# Patient Record
Sex: Female | Born: 1950 | Race: White | Hispanic: No | Marital: Married | Smoking: Never smoker
Health system: Southern US, Community
[De-identification: ages and names within clinical notes are randomized; demographics above are authoritative.]

## PROBLEM LIST (undated history)

## (undated) DIAGNOSIS — E785 Hyperlipidemia, unspecified: Secondary | ICD-10-CM

## (undated) HISTORY — PX: ABDOMINAL HYSTERECTOMY: SHX81

## (undated) HISTORY — PX: TONSILLECTOMY: SUR1361

---

## 2010-01-29 HISTORY — PX: BREAST SURGERY: SHX581

## 2010-05-27 ENCOUNTER — Ambulatory Visit (INDEPENDENT_AMBULATORY_CARE_PROVIDER_SITE_OTHER): Payer: BC Managed Care – PPO | Admitting: Family Medicine

## 2010-05-27 ENCOUNTER — Encounter: Payer: Self-pay | Admitting: Family Medicine

## 2010-05-27 VITALS — BP 130/76 | HR 71 | Temp 98.2°F | Ht 63.5 in | Wt 142.0 lb

## 2010-05-27 DIAGNOSIS — M719 Bursopathy, unspecified: Secondary | ICD-10-CM

## 2010-05-27 DIAGNOSIS — M75100 Unspecified rotator cuff tear or rupture of unspecified shoulder, not specified as traumatic: Secondary | ICD-10-CM

## 2010-05-27 DIAGNOSIS — Z130 Encounter for screening for diseases of the blood and blood-forming organs and certain disorders involving the immune mechanism: Secondary | ICD-10-CM

## 2010-05-27 DIAGNOSIS — M67919 Unspecified disorder of synovium and tendon, unspecified shoulder: Secondary | ICD-10-CM

## 2010-05-27 DIAGNOSIS — Z13220 Encounter for screening for lipoid disorders: Secondary | ICD-10-CM

## 2010-05-27 DIAGNOSIS — I499 Cardiac arrhythmia, unspecified: Secondary | ICD-10-CM

## 2010-05-27 DIAGNOSIS — I491 Atrial premature depolarization: Secondary | ICD-10-CM | POA: Insufficient documentation

## 2010-05-27 DIAGNOSIS — M25511 Pain in right shoulder: Secondary | ICD-10-CM

## 2010-05-27 NOTE — Assessment & Plan Note (Signed)
3 mos of R shoulder pain c/w R shoulder bursitis/ RTC tendonopathy. Will treat with Aleve and ice for now.  Referral to sports med done today for injection and PT. Normal xray with Dr Myrtis Ser.

## 2010-05-27 NOTE — Patient Instructions (Signed)
Referral made to Dr Penni Bombard downstairs. Use ICE on and off and Aleve for pain and inflammation in the R shoulder.  Return for follow up / fasting labs in 3 mos.

## 2010-05-27 NOTE — Progress Notes (Signed)
  Subjective:    Patient ID: Suzanne Welch, female    DOB: [birth year], 60 y.o.   MRN: [id number]  HPI  60 yo WF presents for R shoulder pain that started the end of Dec 2011.  Denies injury, previous issue or surgery w/ R shoulder.  Radiation only into the deltoid w/o radiation down the arm.  Denies tingling, numbness or weakness.  Denies neck pain.  Has full ROM.  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  Aleve helps some but she does not want to stay on meds.  She saw Dr Myrtis Ser (chiropractor) and had manipulation done (didn't help) and an xray (normal).  Denies swelling, redenss, bruising.    BP 130/76  Pulse 71  Temp(Src) 98.2 F (36.8 C) (Oral)  Ht 5' 3.5" (1.613 m)  Wt 142 lb (64.411 kg)  BMI 24.76 kg/m2  SpO2 98% BP 130/76  Pulse 71  Temp(Src) 98.2 F (36.8 C) (Oral)  Ht 5' 3.5" (1.613 m)  Wt 142 lb (64.411 kg)  BMI 24.76 kg/m2  SpO2 98%  No past medical history on file.  Past Surgical History  Procedure Date  . Tonsillectomy   . Abdominal hysterectomy     for fibroids  . Cesarean section   . Breast surgery 01-2010    L breast biopsy - atypical hyperplasia    Family History  Problem Relation Age of Onset  . Cancer Mother   . Hypertension Mother   . Heart disease Father   . Hyperlipidemia Father   . Hypertension Father   . Thyroid disease Sister     History   Social History  . Marital Status: Married    Spouse Name: Sherilyn Cooter    Number of Children: 2  . Years of Education: grad schoo   Occupational History  . music publisher     [employer]   Social History Main Topics  . Smoking status: Never Smoker   . Smokeless tobacco: Never Used  . Alcohol Use: No  . Drug Use: No  . Sexually Active: Yes    Birth Control/ Protection: Surgical   Other Topics Concern  . Not on file   Social History Narrative  . No narrative on file    Allergies  Allergen Reactions  . Penicillins     No current outpatient prescriptions on  file.  Review of Systems  Denies CP, papitations, SOB, swelling or lightheadeness.     Objective:   Physical Exam  Constitutional: She appears well-developed and well-nourished. No distress.  Neck: Normal range of motion. Neck supple. No thyromegaly present.  Cardiovascular: Normal rate.  An irregular rhythm present.  Pulmonary/Chest: Effort normal and breath sounds normal. No respiratory distress. She has no wheezes. She has no rales.  Musculoskeletal:       Right shoulder: She exhibits tenderness (tender over R deltoid) and pain. She exhibits normal range of motion, no swelling, no effusion, no crepitus, no deformity, no spasm and normal strength.       Cervical back: She exhibits normal range of motion, no tenderness and no spasm.  Neurological:       Grip + 5/5  Psychiatric: She has a normal mood and affect.          Assessment & Plan:

## 2010-05-27 NOTE — Assessment & Plan Note (Signed)
EKG done for cardiac arrythmia shows sinus rhythm at rate of 63 bpm, normal axis with PACs. She is asymtpomatic.  Will screen for underlying cause- check electrolytes, thyroid function.  Avoid caffeine.

## 2010-06-01 ENCOUNTER — Encounter: Payer: Self-pay | Admitting: Family Medicine

## 2010-06-11 ENCOUNTER — Ambulatory Visit: Payer: BC Managed Care – PPO | Attending: Sports Medicine | Admitting: Physical Therapy

## 2010-06-11 DIAGNOSIS — M25519 Pain in unspecified shoulder: Secondary | ICD-10-CM | POA: Insufficient documentation

## 2010-06-11 DIAGNOSIS — M542 Cervicalgia: Secondary | ICD-10-CM | POA: Insufficient documentation

## 2010-06-11 DIAGNOSIS — M6281 Muscle weakness (generalized): Secondary | ICD-10-CM | POA: Insufficient documentation

## 2010-06-11 DIAGNOSIS — IMO0001 Reserved for inherently not codable concepts without codable children: Secondary | ICD-10-CM | POA: Insufficient documentation

## 2010-06-12 ENCOUNTER — Other Ambulatory Visit: Payer: BC Managed Care – PPO | Admitting: Family Medicine

## 2010-06-12 ENCOUNTER — Encounter: Payer: Self-pay | Admitting: Family Medicine

## 2010-06-12 DIAGNOSIS — I499 Cardiac arrhythmia, unspecified: Secondary | ICD-10-CM

## 2010-06-12 DIAGNOSIS — Z13220 Encounter for screening for lipoid disorders: Secondary | ICD-10-CM

## 2010-06-12 DIAGNOSIS — N6099 Unspecified benign mammary dysplasia of unspecified breast: Secondary | ICD-10-CM | POA: Insufficient documentation

## 2010-06-12 DIAGNOSIS — M810 Age-related osteoporosis without current pathological fracture: Secondary | ICD-10-CM | POA: Insufficient documentation

## 2010-06-12 DIAGNOSIS — Z13228 Encounter for screening for other metabolic disorders: Secondary | ICD-10-CM

## 2010-06-12 LAB — CBC WITH DIFFERENTIAL/PLATELET
Lymphocytes Relative: 44 % (ref 12–46)
Lymphs Abs: 2.7 10*3/uL (ref 0.7–4.0)
Neutrophils Relative %: 52 % (ref 43–77)
Platelets: 229 10*3/uL (ref 150–400)
RBC: 5.01 MIL/uL (ref 3.87–5.11)
WBC: 6.2 10*3/uL (ref 4.0–10.5)

## 2010-06-13 LAB — LIPID PANEL
Cholesterol: 273 mg/dL — ABNORMAL HIGH (ref 0–200)
LDL Cholesterol: 176 mg/dL — ABNORMAL HIGH (ref 0–99)
Triglycerides: 81 mg/dL (ref <150)

## 2010-06-13 LAB — COMPLETE METABOLIC PANEL WITHOUT GFR
ALT: 18 U/L (ref 0–35)
AST: 22 U/L (ref 0–37)
Albumin: 4.6 g/dL (ref 3.5–5.2)
Alkaline Phosphatase: 72 U/L (ref 39–117)
BUN: 22 mg/dL (ref 6–23)
CO2: 27 meq/L (ref 19–32)
Calcium: 9.5 mg/dL (ref 8.4–10.5)
Chloride: 102 meq/L (ref 96–112)
Creat: 0.7 mg/dL (ref 0.40–1.20)
GFR, Est African American: >60 mL/min
GFR, Est Non African American: >60 mL/min
Glucose, Bld: 83 mg/dL (ref 70–99)
Potassium: 4.5 meq/L (ref 3.5–5.3)
Sodium: 142 meq/L (ref 135–145)
Total Bilirubin: 1 mg/dL (ref 0.3–1.2)
Total Protein: 6.9 g/dL (ref 6.0–8.3)

## 2010-06-15 ENCOUNTER — Telehealth: Payer: Self-pay | Admitting: Family Medicine

## 2010-06-15 NOTE — Telephone Encounter (Signed)
Pls let pt know that her blood counts, fasting sugar, liver and kidney function came back normal but her cholesterol is very high.  Her total is 273 and her LDL bad chol is 176, 76 points above goal.  She needs cholesterol lowering meds to reduce her risk for heart attack and stroke.  I plan to start her on Lipitor at bedtime and recheck labs in 8 wks.  Is she OK with this?

## 2010-06-15 NOTE — Telephone Encounter (Signed)
Pt aware of the above but would like to discuss before starting any meds. Pt transferred to scheduling.

## 2010-06-16 ENCOUNTER — Ambulatory Visit: Payer: BC Managed Care – PPO | Admitting: Physical Therapy

## 2010-06-17 ENCOUNTER — Encounter: Payer: Self-pay | Admitting: Family Medicine

## 2010-06-17 ENCOUNTER — Ambulatory Visit (INDEPENDENT_AMBULATORY_CARE_PROVIDER_SITE_OTHER): Payer: BC Managed Care – PPO | Admitting: Family Medicine

## 2010-06-17 VITALS — BP 135/79 | HR 73 | Ht 63.5 in | Wt 141.0 lb

## 2010-06-17 DIAGNOSIS — E785 Hyperlipidemia, unspecified: Secondary | ICD-10-CM | POA: Insufficient documentation

## 2010-06-17 MED ORDER — ROSUVASTATIN CALCIUM 5 MG PO TABS: 5.0000 mg | ORAL_TABLET | Freq: Every day | ORAL | Status: DC

## 2010-06-17 NOTE — Patient Instructions (Signed)
Start Crestor 5 mg at bedtime for high cholesterol.  Call me if any problems.  I'd like to see you back 8 wks after starting it and we will recheck your LDL and liver function.  Work on [employer] (see [email or web address] ) for low cholesterol diet info and regular exercise.

## 2010-06-17 NOTE — Progress Notes (Signed)
  Subjective:    Patient ID: Suzanne Welch, female    DOB: 02-06-1951, 60 y.o.   MRN: [id number]  HPI  60 yo WF presents for f/u high cholesterol today.  Had labs drawn last wk and her LDL was 176.  She has never been on cholesterol medicine but has fam hx of high cholesterol and heart dz.  She is not a smoker, not diabetic and does not have HTN.  She eats fairly healthy but does not do much exercise.  She also has osteoporosis and a recent breast biopsy was + for atypical lobular hyperplasia and she is seeing oncology to discuss anti - esterogen therapy.  (likely Evista for both conditions).   BP 135/79  Pulse 73  Ht 5' 3.5" (1.613 m)  Wt 141 lb (63.957 kg)  BMI 24.59 kg/m2  SpO2 98% Patient Active Problem List  Diagnoses  . Right shoulder pain  . Premature atrial contraction  . Osteoporosis  . Atypical lobular hyperplasia of breast      Review of Systems  Respiratory: Negative for shortness of breath.   Cardiovascular: Negative for chest pain and palpitations.       Objective:   Physical Exam  Constitutional: She appears well-developed and well-nourished.       tearful  Cardiovascular: Normal rate, regular rhythm and normal heart sounds.   No murmur heard. Pulmonary/Chest: Effort normal and breath sounds normal.  Musculoskeletal: She exhibits no edema.          Assessment & Plan:

## 2010-06-17 NOTE — Assessment & Plan Note (Signed)
I reviewed Suzanne Welch's labs with her today and gave her a copy.  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  I explained that this can contributed to heart attack and stroke and that diet and exercise are not enough.  She finally agrees to starting Crestor 5 mg/ night.  She is postmenopausal.  Will see her back 8 wks after starting to recheck labs and she will let me know if she gets adverse side effects.

## 2010-06-18 ENCOUNTER — Ambulatory Visit: Payer: BC Managed Care – PPO

## 2010-06-22 ENCOUNTER — Ambulatory Visit: Payer: BC Managed Care – PPO | Admitting: Physical Therapy

## 2010-06-25 ENCOUNTER — Ambulatory Visit: Payer: BC Managed Care – PPO | Admitting: Physical Therapy

## 2010-06-30 ENCOUNTER — Ambulatory Visit: Payer: BC Managed Care – PPO | Attending: Sports Medicine | Admitting: Physical Therapy

## 2010-06-30 DIAGNOSIS — IMO0001 Reserved for inherently not codable concepts without codable children: Secondary | ICD-10-CM | POA: Insufficient documentation

## 2010-06-30 DIAGNOSIS — M542 Cervicalgia: Secondary | ICD-10-CM | POA: Insufficient documentation

## 2010-06-30 DIAGNOSIS — M25519 Pain in unspecified shoulder: Secondary | ICD-10-CM | POA: Insufficient documentation

## 2010-06-30 DIAGNOSIS — M6281 Muscle weakness (generalized): Secondary | ICD-10-CM | POA: Insufficient documentation

## 2010-07-03 ENCOUNTER — Ambulatory Visit: Payer: BC Managed Care – PPO | Admitting: Physical Therapy

## 2010-07-07 ENCOUNTER — Telehealth: Payer: Self-pay | Admitting: *Deleted

## 2010-07-07 NOTE — Telephone Encounter (Signed)
Pt called stating brother-in-law works for [employer] and he recommended Pt have LDL-P drawn to further look at her LDL bc he thinks she should not be on meds since her HDL and TGs were so good. Pt requests a CB from Dr. B to discuss. I called Darl Pikes in the lab and she said the only other lab for LDL is the direct LDL.

## 2010-07-08 NOTE — Telephone Encounter (Signed)
LMOM informing Pt of the above 

## 2010-07-08 NOTE — Telephone Encounter (Signed)
There is no data to support checking LDL particle size.   She has zero cardiovascular risk factors so her threshhold to start treatment is for LDL >160 and she is 176.  This is per NCEP guidelines.Though her HDL is good, the LDL can still clog arteries.  My recommendation is to treat her with statin therapy to reduce her risk for heart attack and stroke. If she wants to see the Pharm D at [facility], I will set this up for her.

## 2010-07-09 ENCOUNTER — Encounter: Payer: Self-pay | Admitting: Family Medicine

## 2010-08-21 ENCOUNTER — Other Ambulatory Visit: Payer: Self-pay | Admitting: *Deleted

## 2010-08-26 ENCOUNTER — Ambulatory Visit: Payer: BC Managed Care – PPO | Admitting: Family Medicine

## 2010-09-25 ENCOUNTER — Ambulatory Visit (INDEPENDENT_AMBULATORY_CARE_PROVIDER_SITE_OTHER): Payer: BC Managed Care – PPO | Admitting: Family Medicine

## 2010-09-25 ENCOUNTER — Encounter: Payer: Self-pay | Admitting: Family Medicine

## 2010-09-25 VITALS — BP 118/73 | HR 67 | Resp 20 | Ht 64.0 in | Wt 130.0 lb

## 2010-09-25 DIAGNOSIS — E785 Hyperlipidemia, unspecified: Secondary | ICD-10-CM

## 2010-09-25 DIAGNOSIS — N6099 Unspecified benign mammary dysplasia of unspecified breast: Secondary | ICD-10-CM

## 2010-09-25 LAB — HEPATIC FUNCTION PANEL
Albumin: 4.7 g/dL (ref 3.5–5.2)
Alkaline Phosphatase: 69 U/L (ref 39–117)
Indirect Bilirubin: 0.9 mg/dL (ref 0.0–0.9)
Total Bilirubin: 1.1 mg/dL (ref 0.3–1.2)

## 2010-09-25 LAB — LDL CHOLESTEROL, DIRECT: Direct LDL: 108 mg/dL — ABNORMAL HIGH

## 2010-09-25 NOTE — Assessment & Plan Note (Signed)
Will check a direct LDL and liver profile today for new start Crestor 2 mos ago.  Tolerating well.  Congratulated her on making lifestyle changes.

## 2010-09-25 NOTE — Progress Notes (Signed)
  Subjective:    Patient ID: Tecora Eustache, female    DOB: 1950/05/29, 60 y.o.   MRN: [id number]  HPI  60 yo WF presents for f/u high cholesterol.  She was started on Crestor 5 mg/hs 2 mos ago.  Tolerating well.  She has been walking for exercise.  Denies CP or DOE.  She has lost 11 lbs.  Eating healthy.  BP 118/73  Pulse 67  Resp 20  Ht 5\' 4"  (1.626 m)  Wt 130 lb (58.968 kg)  BMI 22.31 kg/m2  SpO2 98%  LMP 05/31/2007     Review of Systems as per HPI    Objective:   Physical Exam  Constitutional: She appears well-developed and well-nourished.  Cardiovascular: Normal rate, regular rhythm and normal heart sounds.   Pulmonary/Chest: Effort normal and breath sounds normal.  Musculoskeletal: She exhibits no edema.  Psychiatric: She has a normal mood and affect.          Assessment & Plan:   No problem-specific assessment & plan notes found for this encounter.

## 2010-09-25 NOTE — Patient Instructions (Addendum)
Labs downstairs today.  Will call you w/ results on Monday.  Keep up the good work and best of luck with upcoming breast MRI.  Return for a PHYSICAL in 6 mos.

## 2010-09-26 ENCOUNTER — Telehealth: Payer: Self-pay | Admitting: Family Medicine

## 2010-09-26 MED ORDER — ROSUVASTATIN CALCIUM 5 MG PO TABS: 5.0000 mg | ORAL_TABLET | Freq: Every day | ORAL | Status: DC

## 2010-09-26 NOTE — Telephone Encounter (Signed)
Pls let pt know that her bad LDL cholesterol dropped from 176 to 108 and liver enzymes are normal.  Great news.  Stay on current dose of Crestor.

## 2010-09-28 NOTE — Telephone Encounter (Signed)
LMOM informing Pt  

## 2010-12-02 DIAGNOSIS — C4491 Basal cell carcinoma of skin, unspecified: Secondary | ICD-10-CM

## 2010-12-02 HISTORY — DX: Basal cell carcinoma of skin, unspecified: C44.91

## 2010-12-17 ENCOUNTER — Encounter: Payer: Self-pay | Admitting: Family Medicine

## 2011-03-30 ENCOUNTER — Encounter: Payer: BC Managed Care – PPO | Admitting: Family Medicine

## 2011-07-19 DIAGNOSIS — C4491 Basal cell carcinoma of skin, unspecified: Secondary | ICD-10-CM | POA: Insufficient documentation

## 2011-07-19 DIAGNOSIS — Z803 Family history of malignant neoplasm of breast: Secondary | ICD-10-CM | POA: Insufficient documentation

## 2011-07-19 DIAGNOSIS — Z780 Asymptomatic menopausal state: Secondary | ICD-10-CM | POA: Insufficient documentation

## 2011-11-18 ENCOUNTER — Encounter: Payer: Self-pay | Admitting: Emergency Medicine

## 2011-11-18 ENCOUNTER — Emergency Department (INDEPENDENT_AMBULATORY_CARE_PROVIDER_SITE_OTHER)
Admission: EM | Admit: 2011-11-18 | Discharge: 2011-11-18 | Disposition: A | Discharge status: Home / Self Care | Payer: BC Managed Care – PPO | Source: Home / Self Care | Attending: Family Medicine | Admitting: Family Medicine

## 2011-11-18 ENCOUNTER — Emergency Department (INDEPENDENT_AMBULATORY_CARE_PROVIDER_SITE_OTHER): Payer: BC Managed Care – PPO

## 2011-11-18 DIAGNOSIS — W2209XA Striking against other stationary object, initial encounter: Secondary | ICD-10-CM

## 2011-11-18 DIAGNOSIS — M7989 Other specified soft tissue disorders: Secondary | ICD-10-CM

## 2011-11-18 DIAGNOSIS — H9190 Unspecified hearing loss, unspecified ear: Secondary | ICD-10-CM

## 2011-11-18 DIAGNOSIS — M26629 Arthralgia of temporomandibular joint, unspecified side: Secondary | ICD-10-CM

## 2011-11-18 DIAGNOSIS — H6121 Impacted cerumen, right ear: Secondary | ICD-10-CM

## 2011-11-18 DIAGNOSIS — H9202 Otalgia, left ear: Secondary | ICD-10-CM

## 2011-11-18 DIAGNOSIS — M25549 Pain in joints of unspecified hand: Secondary | ICD-10-CM

## 2011-11-18 DIAGNOSIS — S6000XA Contusion of unspecified finger without damage to nail, initial encounter: Secondary | ICD-10-CM

## 2011-11-18 DIAGNOSIS — IMO0001 Reserved for inherently not codable concepts without codable children: Secondary | ICD-10-CM

## 2011-11-18 MED ORDER — PREDNISONE 20 MG PO TABS: 20.0000 mg | ORAL_TABLET | Freq: Two times a day (BID) | ORAL | Status: DC

## 2011-11-18 NOTE — ED Notes (Signed)
Left ear pain x 1 month noticed ear did not clear after flying last month

## 2011-11-18 NOTE — ED Provider Notes (Signed)
History     CSN: [id number]  Arrival date & time 11/18/11  1140   First MD Initiated Contact with Patient 11/18/11 1158      Chief Complaint  Patient presents with  . Otalgia     HPI Comments: Patient presents with two problems: 1)  Two days ago she bumped her left 4th finger on a metal trailer, and has had persistent swelling/soreness of the finger.  She has not been able to remove her wedding rings.  She denies distal numbness of finger.  Her Tdap is current 2)  Approximately one month ago she travelled by plane and has persistent sensation of left ear being clogged.  She also has mild pain in her left ear area/jaw, worse when chewing.  She does not know if she grinds her teeth at night.  She reports that she has mild seasonal sinus congestion.  Patient is a 60 y.o. female presenting with hand pain and ear pain. The history is provided by the patient.  Hand Pain This is a new problem. The current episode started 2 days ago. The problem occurs constantly. The problem has not changed since onset.Pertinent negatives include no headaches. Associated symptoms comments: none. Exacerbated by: moving right 4th finger. Nothing relieves the symptoms. Treatments tried: ice pack. The treatment provided mild relief.  Otalgia This is a new problem. Episode onset: one month ago. There is pain in the left ear. The problem occurs constantly. The problem has not changed since onset.There has been no fever. The pain is mild. Associated symptoms include hearing loss. Pertinent negatives include no ear discharge, no headaches, no rhinorrhea, no sore throat, no vomiting, no cough and no rash.    History reviewed. No pertinent past medical history.  Past Surgical History  Procedure Date  . Tonsillectomy   . Abdominal hysterectomy     for fibroids  . Cesarean section   . Breast surgery 01-2010    L breast biopsy - atypical hyperplasia    Family History  Problem Relation Age of Onset  . Cancer Mother    . Hypertension Mother   . Heart disease Father   . Hyperlipidemia Father   . Hypertension Father   . Thyroid disease Sister     History  Substance Use Topics  . Smoking status: Never Smoker   . Smokeless tobacco: Never Used  . Alcohol Use: No    OB History    Grav Para Term Preterm Abortions TAB SAB Ect Mult Living   2 2 2  0 0     2      Review of Systems  HENT: Positive for hearing loss and ear pain. Negative for sore throat, rhinorrhea and ear discharge.   Respiratory: Negative for cough.   Gastrointestinal: Negative for vomiting.  Skin: Negative for rash.  Neurological: Negative for headaches.  All other systems reviewed and are negative.    Allergies  Penicillins  Home Medications   Current Outpatient Rx  Name Route Sig Dispense Refill  . CALTRATE 600+D PLUS PO Oral Take by mouth.      . OMEGA-3 FATTY ACIDS 1000 MG PO CAPS Oral Take 2 g by mouth daily.      Marland Kitchen MAGNESIUM 30 MG PO TABS Oral Take 30 mg by mouth 2 (two) times daily.      Marland Kitchen ONE-DAILY MULTI VITAMINS PO TABS Oral Take 1 tablet by mouth daily.      Marland Kitchen PREDNISONE 20 MG PO TABS Oral Take 1 tablet (20 mg total)  by mouth 2 (two) times daily. Take with food. 10 tablet 0  . ROSUVASTATIN CALCIUM 5 MG PO TABS Oral Take 1 tablet (5 mg total) by mouth at bedtime. 30 tablet 12  . VITAMIN B-1 100 MG PO TABS Oral Take 100 mg by mouth daily.        BP 134/82  Pulse 58  Temp 98.1 F (36.7 C) (Oral)  Resp 12  Ht 5' 3.5" (1.613 m)  Wt 127 lb (57.607 kg)  BMI 22.14 kg/m2  SpO2 98%  LMP 05/31/2007  Physical Exam Nursing notes and Vital Signs reviewed. Appearance:  Patient appears healthy, stated age, and in no acute distress Eyes:  Pupils are equal, round, and reactive to light and accomodation.  Extraocular movement is intact.  Conjunctivae are not inflamed  Ears:  Right canal occluded with cerumen; unable to visualize right tympanic membrane.  Left canal and tympanic membrane are normal.   There is distinct  tenderness to palpation over the left temporomandibular joint.  Palpation there recreates her left ear pain. Nose:  Mildly congested turbinates.  No sinus tenderness.   Pharynx:  Normal Neck:  Supple.  No adenopathy Skin:  No rash present.  Left 4th finger:  full range of motion all joints.  Her wedding ring is in place at base of proximal phalanx.  The distal aspect of proximal phalanx, and the PIP joint, are mildly swollen and tender.  There is enough edema of finger to prevent ring removal.  However, distal sensation and capillary refill are intact.  There is a 4mm dia superficial abrasion over the ulnar aspect of PIP joint, but there is no erythema or warmth, and no evidence of bacterial infection.  ED Course  Procedures   Lavage right ear:  Post lavage, right external auditory canal and tympanic membrane appear normal  Labs Reviewed - Tympanogram:  Normal left ear; low peak height right ear.  Post ear lavage, tympanogram right ear is normal (although "noisy") Dg Finger Ring Left  11/18/2011  *RADIOLOGY REPORT*  Clinical Data: Injured finger with tenderness distally  LEFT RING FINGER 2+V  Comparison: None.  Findings: No acute fracture is seen.  Alignment is normal.  Joint spaces appear normal.  IMPRESSION: Negative.   Original Report Authenticated By: Juline Patch, M.D.      1. Contusion of fourth finger, left   2. Impacted cerumen of right ear   3. TMJ tenderness   4. Otalgia of left ear       MDM   Begin prednisone burst. Apply ice pack to left 4th finger several times daily for about 10 minutes until swelling resolved.  Elevate hand.  Apply Bacitracin and Bandaid to abrasion until healed.  If increased swelling/pain occur in finger advised to return for follow-up; may need to have ring removed. Apply ice pack to left TMJ 2 or 3 times daily.  Begin jaw exercises. Followup with ENT if not improved one week.        Lattie Haw, MD 11/18/11 [id number]

## 2012-03-23 ENCOUNTER — Encounter: Payer: Self-pay | Admitting: *Deleted

## 2012-03-23 ENCOUNTER — Emergency Department (INDEPENDENT_AMBULATORY_CARE_PROVIDER_SITE_OTHER)
Admission: EM | Admit: 2012-03-23 | Discharge: 2012-03-23 | Disposition: A | Discharge status: Home / Self Care | Payer: BC Managed Care – PPO | Source: Home / Self Care | Attending: Family Medicine | Admitting: Family Medicine

## 2012-03-23 DIAGNOSIS — R51 Headache: Secondary | ICD-10-CM

## 2012-03-23 HISTORY — DX: Hyperlipidemia, unspecified: E78.5

## 2012-03-23 MED ORDER — AMOXICILLIN 875 MG PO TABS: 875.0000 mg | ORAL_TABLET | Freq: Two times a day (BID) | ORAL | Status: DC

## 2012-03-23 MED ORDER — PREDNISONE 20 MG PO TABS: 20.0000 mg | ORAL_TABLET | Freq: Two times a day (BID) | ORAL | Status: DC

## 2012-03-23 NOTE — ED Notes (Signed)
Patient c/o jaw pain, sinus pain and facial swelling.

## 2012-03-23 NOTE — ED Provider Notes (Signed)
History     CSN: [id number]  Arrival date & time 03/23/12  1421   First MD Initiated Contact with Patient 03/23/12 1504      Chief Complaint  Patient presents with  . Jaw Pain  . Sinus Problem      HPI Comments: Patient complains of increased sinus congestion and chills for about a week.  She has noted left facial discomfort and mild swelling   The history is provided by the patient.    Past Medical History  Diagnosis Date  . Hyperlipidemia     Past Surgical History  Procedure Date  . Tonsillectomy   . Abdominal hysterectomy     for fibroids  . Cesarean section   . Breast surgery 01-2010    L breast biopsy - atypical hyperplasia    Family History  Problem Relation Age of Onset  . Cancer Mother   . Hypertension Mother   . Heart disease Father   . Hyperlipidemia Father   . Hypertension Father   . Thyroid disease Sister     History  Substance Use Topics  . Smoking status: Never Smoker   . Smokeless tobacco: Never Used  . Alcohol Use: No    OB History    Grav Para Term Preterm Abortions TAB SAB Ect Mult Living   2 2 2  0 0     2      Review of Systems + sore throat + cough No pleuritic pain No wheezing + nasal congestion + post-nasal drainage ? sinus pain/pressure No itchy/red eyes No earache No hemoptysis No SOB No fever, + chills No nausea No vomiting No abdominal pain No diarrhea No urinary symptoms No skin rashes + fatigue No myalgias + headache Used OTC meds without relief    Allergies  Penicillins  Home Medications   Current Outpatient Rx  Name  Route  Sig  Dispense  Refill  . AMOXICILLIN 875 MG PO TABS   Oral   Take 1 tablet (875 mg total) by mouth 2 (two) times daily.   14 tablet   0   . CALTRATE 600+D PLUS PO   Oral   Take by mouth.           . OMEGA-3 FATTY ACIDS 1000 MG PO CAPS   Oral   Take 2 g by mouth daily.           Marland Kitchen MAGNESIUM 30 MG PO TABS   Oral   Take 30 mg by mouth 2 (two) times daily.          Marland Kitchen ONE-DAILY MULTI VITAMINS PO TABS   Oral   Take 1 tablet by mouth daily.           Marland Kitchen PREDNISONE 20 MG PO TABS   Oral   Take 1 tablet (20 mg total) by mouth 2 (two) times daily. Take with food.   10 tablet   0   . ROSUVASTATIN CALCIUM 5 MG PO TABS   Oral   Take 1 tablet (5 mg total) by mouth at bedtime.   30 tablet   12   . VITAMIN B-1 100 MG PO TABS   Oral   Take 100 mg by mouth daily.             BP 135/75  Pulse 75  Temp 98.1 F (36.7 C) (Oral)  Resp 12  Ht 5' 3.5" (1.613 m)  Wt 132 lb (59.875 kg)  BMI 23.02 kg/m2  SpO2 98%  LMP 05/31/2007  Physical Exam Nursing notes and Vital Signs reviewed. Appearance:  Patient appears healthy, stated age, and in no acute distress Eyes:  Pupils are equal, round, and reactive to light and accomodation.  Extraocular movement is intact.  Conjunctivae are not inflamed  Ears:  Canals normal.  Tympanic membranes normal.  Nose:  Mildly congested turbinates.   Left maxillary sinus tenderness is present.  Mouth:  Normal teeth Pharynx:  Normal Neck:  Supple.  Slightly tender shotty posterior nodes are palpated bilaterally  Lungs:  Clear to auscultation.  Breath sounds are equal.  Heart:  Regular rate and rhythm without murmurs, rubs, or gallops.  Skin:  No rash present.   ED Course  Procedures none      1. Facial pain; ?sinusitis       MDM  Suggested sinus films but patient wishes to minimize cost. Begin amoxicillin and prednisone burst Take Mucinex D (guaifenesin with decongestant) twice daily for congestion.  Increase fluid intake, rest. May use Afrin nasal spray (or generic oxymetazoline) twice daily for about 5 days.  Also recommend using saline nasal spray several times daily and saline nasal irrigation (AYR is a common brand) Stop all antihistamines for now, and other non-prescription cough/cold preparations. Followup with ENT if not improving         Lattie Haw, MD 03/24/12 1114

## 2013-10-01 ENCOUNTER — Ambulatory Visit (INDEPENDENT_AMBULATORY_CARE_PROVIDER_SITE_OTHER): Payer: BC Managed Care – PPO | Admitting: Physical Therapy

## 2013-10-01 DIAGNOSIS — M25619 Stiffness of unspecified shoulder, not elsewhere classified: Secondary | ICD-10-CM

## 2013-10-01 DIAGNOSIS — M6281 Muscle weakness (generalized): Secondary | ICD-10-CM

## 2013-10-01 DIAGNOSIS — M25519 Pain in unspecified shoulder: Secondary | ICD-10-CM

## 2013-10-01 DIAGNOSIS — R609 Edema, unspecified: Secondary | ICD-10-CM

## 2013-10-05 ENCOUNTER — Encounter (INDEPENDENT_AMBULATORY_CARE_PROVIDER_SITE_OTHER): Payer: BC Managed Care – PPO | Admitting: Physical Therapy

## 2013-10-05 DIAGNOSIS — M25619 Stiffness of unspecified shoulder, not elsewhere classified: Secondary | ICD-10-CM

## 2013-10-05 DIAGNOSIS — R609 Edema, unspecified: Secondary | ICD-10-CM

## 2013-10-05 DIAGNOSIS — M6281 Muscle weakness (generalized): Secondary | ICD-10-CM

## 2013-10-05 DIAGNOSIS — M25519 Pain in unspecified shoulder: Secondary | ICD-10-CM

## 2013-10-08 ENCOUNTER — Encounter (INDEPENDENT_AMBULATORY_CARE_PROVIDER_SITE_OTHER): Payer: BC Managed Care – PPO | Admitting: Physical Therapy

## 2013-10-08 DIAGNOSIS — R609 Edema, unspecified: Secondary | ICD-10-CM

## 2013-10-08 DIAGNOSIS — M25519 Pain in unspecified shoulder: Secondary | ICD-10-CM

## 2013-10-08 DIAGNOSIS — M6281 Muscle weakness (generalized): Secondary | ICD-10-CM

## 2013-10-08 DIAGNOSIS — M25619 Stiffness of unspecified shoulder, not elsewhere classified: Secondary | ICD-10-CM

## 2013-10-10 ENCOUNTER — Encounter (INDEPENDENT_AMBULATORY_CARE_PROVIDER_SITE_OTHER): Payer: BC Managed Care – PPO | Admitting: Physical Therapy

## 2013-10-10 DIAGNOSIS — M25519 Pain in unspecified shoulder: Secondary | ICD-10-CM

## 2013-10-10 DIAGNOSIS — M6281 Muscle weakness (generalized): Secondary | ICD-10-CM

## 2013-10-10 DIAGNOSIS — R609 Edema, unspecified: Secondary | ICD-10-CM

## 2013-10-10 DIAGNOSIS — M25619 Stiffness of unspecified shoulder, not elsewhere classified: Secondary | ICD-10-CM

## 2013-10-15 ENCOUNTER — Encounter (INDEPENDENT_AMBULATORY_CARE_PROVIDER_SITE_OTHER): Payer: BC Managed Care – PPO | Admitting: Physical Therapy

## 2013-10-15 DIAGNOSIS — R609 Edema, unspecified: Secondary | ICD-10-CM

## 2013-10-15 DIAGNOSIS — M25519 Pain in unspecified shoulder: Secondary | ICD-10-CM

## 2013-10-15 DIAGNOSIS — M6281 Muscle weakness (generalized): Secondary | ICD-10-CM

## 2013-10-15 DIAGNOSIS — M25619 Stiffness of unspecified shoulder, not elsewhere classified: Secondary | ICD-10-CM

## 2013-10-17 ENCOUNTER — Encounter (INDEPENDENT_AMBULATORY_CARE_PROVIDER_SITE_OTHER): Payer: BC Managed Care – PPO | Admitting: Physical Therapy

## 2013-10-17 DIAGNOSIS — M6281 Muscle weakness (generalized): Secondary | ICD-10-CM

## 2013-10-17 DIAGNOSIS — M25619 Stiffness of unspecified shoulder, not elsewhere classified: Secondary | ICD-10-CM

## 2013-10-17 DIAGNOSIS — R609 Edema, unspecified: Secondary | ICD-10-CM

## 2013-10-17 DIAGNOSIS — M25519 Pain in unspecified shoulder: Secondary | ICD-10-CM

## 2013-10-22 ENCOUNTER — Encounter: Payer: BC Managed Care – PPO | Admitting: Physical Therapy

## 2013-10-24 ENCOUNTER — Encounter (INDEPENDENT_AMBULATORY_CARE_PROVIDER_SITE_OTHER): Payer: BC Managed Care – PPO | Admitting: Physical Therapy

## 2013-10-24 DIAGNOSIS — M6281 Muscle weakness (generalized): Secondary | ICD-10-CM

## 2013-10-24 DIAGNOSIS — R609 Edema, unspecified: Secondary | ICD-10-CM

## 2013-10-24 DIAGNOSIS — M25519 Pain in unspecified shoulder: Secondary | ICD-10-CM

## 2013-10-24 DIAGNOSIS — M25619 Stiffness of unspecified shoulder, not elsewhere classified: Secondary | ICD-10-CM

## 2013-11-07 IMAGING — CR DG FINGER RING 2+V*L*
1 series · 1 of 1 positions shown · non-contrast
Comparison: None.

CLINICAL DATA: Injured finger with tenderness distally

LEFT RING FINGER 2+V

[view not recorded]
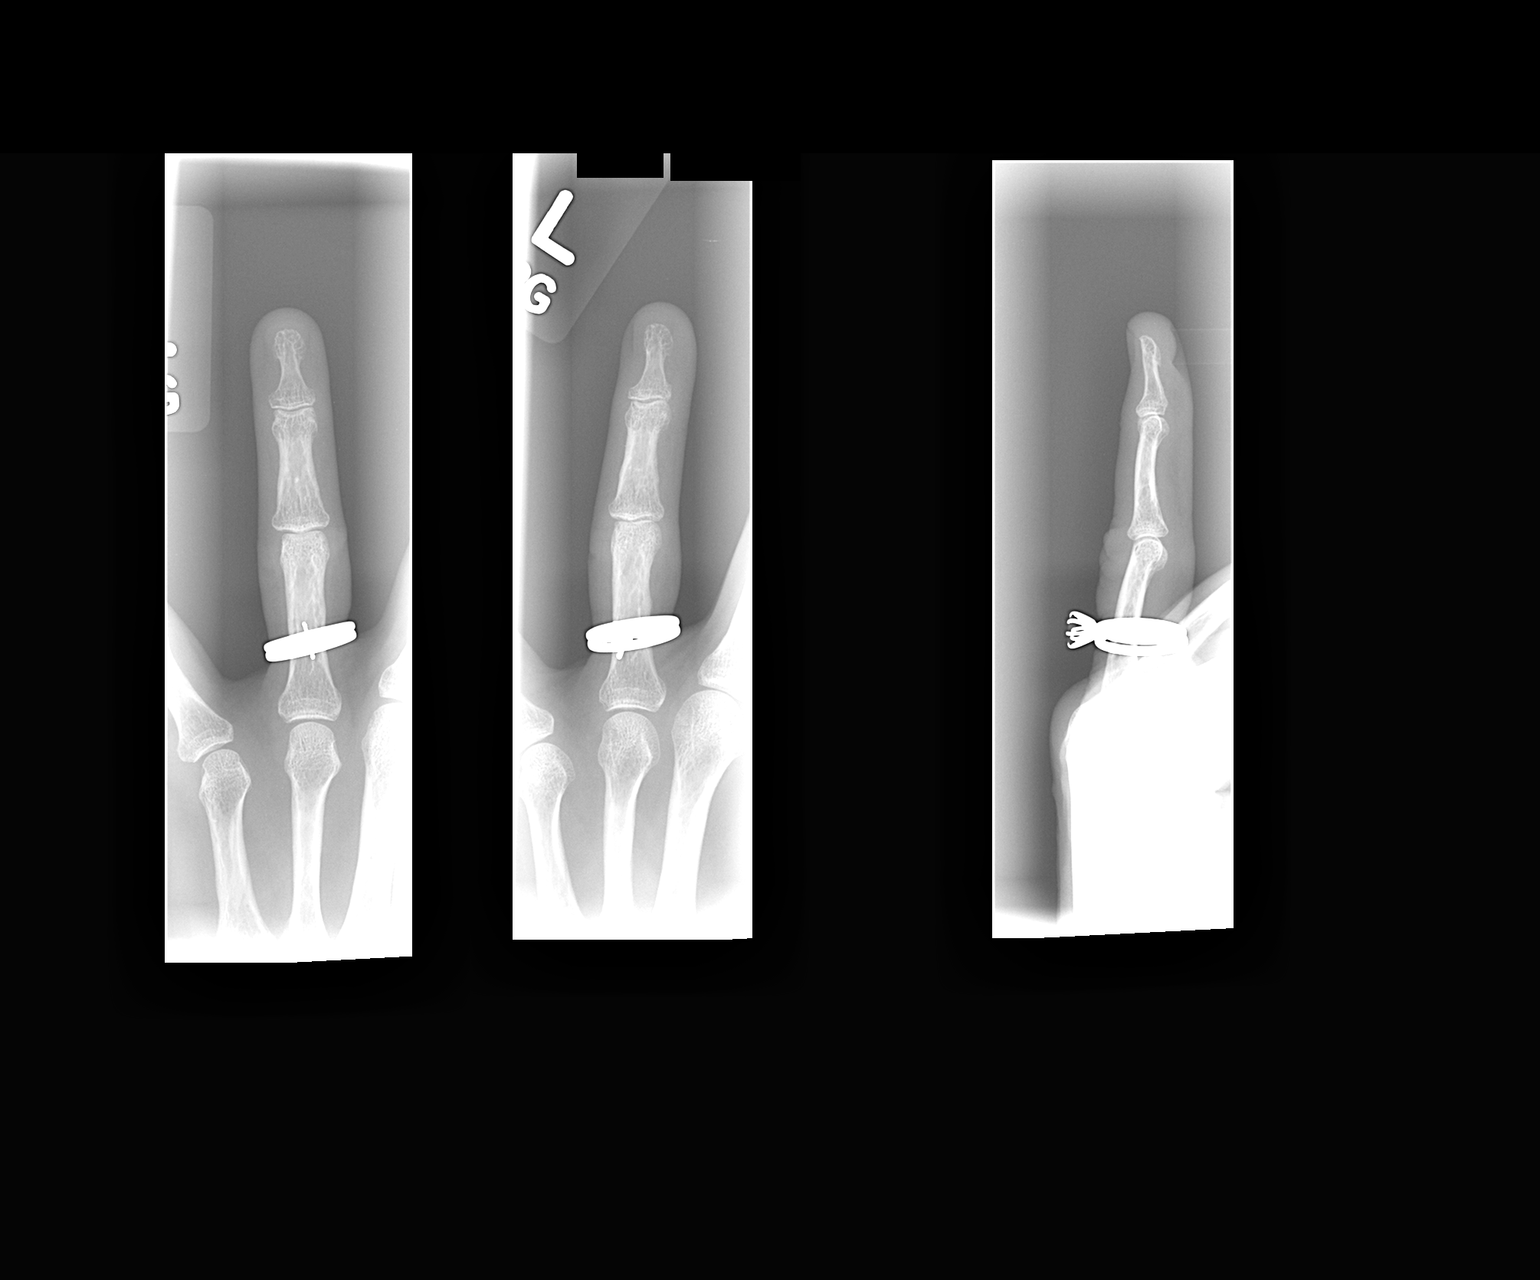

[1 of 1 positions shown; findings below may reference images not displayed]

FINDINGS: No acute fracture is seen.  Alignment is normal.  Joint
spaces appear normal.
IMPRESSION: Negative.

## 2013-12-31 ENCOUNTER — Encounter: Payer: Self-pay | Admitting: *Deleted

## 2015-08-25 ENCOUNTER — Encounter: Payer: Self-pay | Admitting: *Deleted

## 2015-08-25 ENCOUNTER — Emergency Department (INDEPENDENT_AMBULATORY_CARE_PROVIDER_SITE_OTHER)
Admission: EM | Admit: 2015-08-25 | Discharge: 2015-08-25 | Disposition: A | Discharge status: Home / Self Care | Payer: BLUE CROSS/BLUE SHIELD | Source: Home / Self Care | Attending: Family Medicine | Admitting: Family Medicine

## 2015-08-25 DIAGNOSIS — H9201 Otalgia, right ear: Secondary | ICD-10-CM | POA: Diagnosis not present

## 2015-08-25 DIAGNOSIS — J0100 Acute maxillary sinusitis, unspecified: Secondary | ICD-10-CM

## 2015-08-25 MED ORDER — PREDNISONE 20 MG PO TABS: ORAL_TABLET | ORAL | Status: DC

## 2015-08-25 MED ORDER — AMOXICILLIN-POT CLAVULANATE 875-125 MG PO TABS: 1.0000 | ORAL_TABLET | Freq: Two times a day (BID) | ORAL | Status: DC

## 2015-08-25 NOTE — ED Notes (Signed)
Pt c/o cold/uri symptoms x 3 weeks. Afebrile. More recently ear fullness and sinus pressure. Taken decongestant.

## 2015-08-25 NOTE — ED Provider Notes (Signed)
CSN: [id number]     Arrival date & time 08/25/15  1040 History   First MD Initiated Contact with Patient 08/25/15 1048     Chief Complaint  Patient presents with  . Cough  . Sinus Problem   (Consider location/radiation/quality/duration/timing/severity/associated sxs/prior Treatment) HPI Suzanne Welch is a 65 y.o. female presenting to UC with c/o worsening URI symptoms for about 3 weeks with associated Right sided facial pressure and ear fullness.  She has been taking decongestants w/o relief. Symptoms are moderate in severity. Mild productive cough. Denies chest pain or SOB. Denies n/v/d, fever or chills.    Past Medical History  Diagnosis Date  . Hyperlipidemia    Past Surgical History  Procedure Laterality Date  . Tonsillectomy    . Abdominal hysterectomy      for fibroids  . Cesarean section    . Breast surgery  01-2010    L breast biopsy - atypical hyperplasia   Family History  Problem Relation Age of Onset  . Cancer Mother   . Hypertension Mother   . Heart disease Father   . Hyperlipidemia Father   . Hypertension Father   . Thyroid disease Sister    Social History  Substance Use Topics  . Smoking status: Never Smoker   . Smokeless tobacco: Never Used  . Alcohol Use: No   OB History    Gravida Para Term Preterm AB TAB SAB Ectopic Multiple Living   2 2 2  0 0     2     Review of Systems  Constitutional: Negative for fever and chills.  HENT: Positive for congestion, ear pain ( "fullness"), sinus pressure and sore throat. Negative for sneezing, trouble swallowing and voice change.   Respiratory: Positive for cough. Negative for shortness of breath.   Cardiovascular: Negative for chest pain and palpitations.  Gastrointestinal: Negative for nausea, vomiting, abdominal pain and diarrhea.  Musculoskeletal: Negative for myalgias, back pain and arthralgias.  Skin: Negative for rash.    Allergies  Penicillins  Home Medications   Prior to Admission medications    Medication Sig Start Date End Date Taking? Authorizing Provider  atorvastatin (LIPITOR) 10 MG tablet Take 5 mg by mouth daily.   Yes Historical Provider, MD  amoxicillin-clavulanate (AUGMENTIN) 875-125 MG tablet Take 1 tablet by mouth 2 (two) times daily. One po bid x 7 days 08/25/15   Noland Fordyce, PA-C  predniSONE (DELTASONE) 20 MG tablet 3 tabs po day one, then 2 po daily x 4 days 08/25/15   Noland Fordyce, PA-C   Meds Ordered and Administered this Visit  Medications - No data to display  BP 149/83 mmHg  Pulse 55  Temp(Src) 98 F (36.7 C) (Oral)  Resp 14  Wt 143 lb (64.864 kg)  SpO2 100%  LMP 05/31/2007 No data found.   Physical Exam  Constitutional: She appears well-developed and well-nourished. No distress.  HENT:  Head: Normocephalic and atraumatic.  Right Ear: Tympanic membrane normal.  Left Ear: Tympanic membrane normal.  Nose: Right sinus exhibits maxillary sinus tenderness and frontal sinus tenderness. Left sinus exhibits maxillary sinus tenderness. Left sinus exhibits no frontal sinus tenderness.  Mouth/Throat: Uvula is midline, oropharynx is clear and moist and mucous membranes are normal.  Eyes: Conjunctivae are normal. No scleral icterus.  Neck: Normal range of motion. Neck supple.  Cardiovascular: Normal rate, regular rhythm and normal heart sounds.   Pulmonary/Chest: Effort normal and breath sounds normal. No respiratory distress. She has no wheezes. She has no rales.  Abdominal: Soft. She exhibits no distension. There is no tenderness.  Musculoskeletal: Normal range of motion.  Neurological: She is alert.  Skin: Skin is warm and dry. She is not diaphoretic.  Nursing note and vitals reviewed.   ED Course  Procedures (including critical care time)  Labs Review Labs Reviewed - No data to display  Imaging Review No results found.    MDM   1. Otalgia, right   2. Acute maxillary sinusitis, recurrence not specified    Worsening URI symptoms with Right  sided ear pain and pressure.   Hx and exam c/w sinusitis. Due to duration and worsening symptoms, will start on antibiotic.  Rx: prednisone and augmentin  Advised pt to use acetaminophen and ibuprofen as needed for fever and pain as well as sinus rinses. Encouraged rest and fluids. F/u with PCP in 7-10 days if not improving, sooner if worsening. Pt verbalized understanding and agreement with tx plan.      Noland Fordyce, PA-C 08/25/15 1624

## 2015-08-25 NOTE — Discharge Instructions (Signed)
You may take 400-600mg  Ibuprofen (Motrin) every 6-8 hours for fever and pain  Alternate with Tylenol  You may take 500mg  Tylenol every 4-6 hours as needed for fever and pain  Follow-up with your primary care provider next week for recheck of symptoms if not improving.  Be sure to drink plenty of fluids and rest, at least 8hrs of sleep a night, preferably more while you are sick. Return urgent care or go to closest ER if you cannot keep down fluids/signs of dehydration, fever not reducing with Tylenol, difficulty breathing/wheezing, stiff neck, worsening condition, or other concerns (see below)  Please take antibiotics as prescribed and be sure to complete entire course even if you start to feel better to ensure infection does not come back.  If you develop signs of an allergic reaction including rash, swelling of lips, tongue or throat, while taking the Augmentin, please stop taking the antibiotic immediately and call our office so we can change the medication.

## 2016-02-16 ENCOUNTER — Encounter: Payer: Self-pay | Admitting: *Deleted

## 2016-02-16 ENCOUNTER — Emergency Department (INDEPENDENT_AMBULATORY_CARE_PROVIDER_SITE_OTHER)
Admission: EM | Admit: 2016-02-16 | Discharge: 2016-02-16 | Disposition: A | Discharge status: Home / Self Care | Payer: BLUE CROSS/BLUE SHIELD | Source: Home / Self Care | Attending: Family Medicine | Admitting: Family Medicine

## 2016-02-16 DIAGNOSIS — J069 Acute upper respiratory infection, unspecified: Secondary | ICD-10-CM

## 2016-02-16 DIAGNOSIS — B9789 Other viral agents as the cause of diseases classified elsewhere: Secondary | ICD-10-CM

## 2016-02-16 MED ORDER — AZITHROMYCIN 250 MG PO TABS: ORAL_TABLET | ORAL | 0 refills | Status: DC

## 2016-02-16 MED ORDER — BENZONATATE 200 MG PO CAPS: 200.0000 mg | ORAL_CAPSULE | Freq: Every day | ORAL | 0 refills | Status: DC

## 2016-02-16 NOTE — ED Provider Notes (Signed)
[name]    CSN: [id number] Arrival date & time: 02/16/16  0808     History   Chief Complaint Chief Complaint  Patient presents with  . Cough  . Sore Throat    HPI Suzanne Welch is a 65 y.o. female.   Patient complains of six day history of typical cold-like symptoms developing over several days, including mild sore throat, sinus congestion, chills, headache, fatigue, and cough.   Her cough is worse at night.  She had low grade fever last night.   The history is provided by the patient.    Past Medical History:  Diagnosis Date  . Hyperlipidemia     Patient Active Problem List   Diagnosis Date Noted  . Hyperlipidemia 06/17/2010  . Osteoporosis 06/12/2010  . 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 Right shoulder pain 05/27/2010  . Premature atrial contraction 05/27/2010    Past Surgical History:  Procedure Laterality Date  . ABDOMINAL HYSTERECTOMY     for fibroids  . BREAST SURGERY  01-2010   L breast biopsy - atypical hyperplasia  . CESAREAN SECTION    . TONSILLECTOMY      OB History    Gravida Para Term Preterm AB Living   2 2 2  0 0 2   SAB TAB Ectopic Multiple Live Births                   Home Medications    Prior to Admission medications   Medication Sig Start Date End Date Taking? Authorizing Provider  atorvastatin (LIPITOR) 10 MG tablet Take 5 mg by mouth daily.    Historical Provider, MD  azithromycin (ZITHROMAX Z-PAK) 250 MG tablet Take 2 tabs today; then begin one tab once daily for 4 more days. (Rx void after 02/24/16) 02/16/16   Kandra Nicolas, MD  benzonatate (TESSALON) 200 MG capsule Take 1 capsule (200 mg total) by mouth at bedtime. Take as needed for cough 02/16/16   Kandra Nicolas, MD    Family History Family History  Problem Relation Age of Onset  . Cancer Mother   . Hypertension Mother   . Heart disease Father   . Hyperlipidemia Father   . Hypertension Father   . Thyroid disease Sister      Social History Social History  Substance Use Topics  . Smoking status: Never Smoker  . Smokeless tobacco: Never Used  . Alcohol use No     Allergies   Penicillins   Review of Systems Review of Systems  + sore throat + cough No pleuritic pain No wheezing + nasal congestion + post-nasal drainage No sinus pain/pressure No itchy/red eyes No earache No hemoptysis No SOB + fever, + chills No nausea No vomiting No abdominal pain No diarrhea No urinary symptoms No skin rash + fatigue + myalgias + headache Used OTC meds without relief    Physical Exam Triage Vital Signs ED Triage Vitals  Enc Vitals Group     BP 02/16/16 0822 155/84     Pulse Rate 02/16/16 0822 77     Resp 02/16/16 0822 14     Temp 02/16/16 0822 98.6 F (37 C)     Temp Source 02/16/16 0822 Oral     SpO2 02/16/16 0822 98 %     Weight 02/16/16 0823 138 lb (62.6 kg)     Height --      Head Circumference --      Peak Flow --  Pain Score 02/16/16 0824 0     Pain Loc --      Pain Edu? --      Excl. in [place]? --    No data found.   Updated Vital Signs BP 155/84 (BP Location: Left Arm)   Pulse 77   Temp 98.6 F (37 C) (Oral)   Resp 14   Wt 138 lb (62.6 kg)   LMP 05/31/2007   SpO2 98%   BMI 24.06 kg/m   Visual Acuity Right Eye Distance:   Left Eye Distance:   Bilateral Distance:    Right Eye Near:   Left Eye Near:    Bilateral Near:     Physical Exam Nursing notes and Vital Signs reviewed. Appearance:  Patient appears stated age, and in no acute distress Eyes:  Pupils are equal, round, and reactive to light and accomodation.  Extraocular movement is intact.  Conjunctivae are not inflamed  Ears:  Canals normal.  Tympanic membranes normal.  Nose:  Mildly congested turbinates.  No sinus tenderness.   Pharynx:  Normal Neck:  Supple.  Tender enlarged posterior/lateral nodes are palpated bilaterally  Lungs:  Clear to auscultation.  Breath sounds are equal.  Moving air  well. Chest:  Distinct tenderness to palpation over the mid-sternum.  Heart:  Regular rate and rhythm without murmurs, rubs, or gallops.  Abdomen:  Nontender without masses or hepatosplenomegaly.  Bowel sounds are present.  No CVA or flank tenderness.  Extremities:  No edema.  Skin:  No rash present.    UC Treatments / Results  Labs (all labs ordered are listed, but only abnormal results are displayed) Labs Reviewed - No data to display  EKG  EKG Interpretation None       Radiology No results found.  Procedures Procedures (including critical care time)  Medications Ordered in UC Medications - No data to display   Initial Impression / Assessment and Plan / UC Course  I have reviewed the triage vital signs and the nursing notes.  Pertinent labs & imaging results that were available during my care of the patient were reviewed by me and considered in my medical decision making (see chart for details).  Clinical Course   There is no evidence of bacterial infection today.   Prescription written for Benzonatate [facility]) to take at bedtime for night-time cough.   Take plain guaifenesin (1200mg  extended release tabs such as Mucinex) twice daily, with plenty of water, for cough and congestion.  May add Pseudoephedrine (30mg , one or two every 4 to 6 hours) for sinus congestion.  Get adequate rest.   May use Afrin nasal spray (or generic oxymetazoline) twice daily for about 5 days and then discontinue.  Also recommend using saline nasal spray several times daily and saline nasal irrigation (AYR is a common brand).  Use Flonase nasal spray each morning after using Afrin nasal spray and saline nasal irrigation. Try warm salt water gargles for sore throat.  Stop all antihistamines for now, and other non-prescription cough/cold preparations. May take Ibuprofen 200mg , 4 tabs every 8 hours with food for chest/sternum discomfort, body aches, etc. Begin Azithromycin if not improving about one  week or if persistent fever develops (Given a prescription to hold, with an expiration date)  Follow-up with family doctor if not improving about10 days.      Final Clinical Impressions(s) / UC Diagnoses   Final diagnoses:  Viral URI with cough    New Prescriptions New Prescriptions   AZITHROMYCIN (ZITHROMAX Z-PAK) 250  MG TABLET    Take 2 tabs today; then begin one tab once daily for 4 more days. (Rx void after 02/24/16)   BENZONATATE (TESSALON) 200 MG CAPSULE    Take 1 capsule (200 mg total) by mouth at bedtime. Take as needed for cough     Kandra Nicolas, MD 02/29/16 [phone]

## 2016-02-16 NOTE — Discharge Instructions (Signed)
Take plain guaifenesin (1200mg  extended release tabs such as Mucinex) twice daily, with plenty of water, for cough and congestion.  May add Pseudoephedrine (30mg , one or two every 4 to 6 hours) for sinus congestion.  Get adequate rest.   May use Afrin nasal spray (or generic oxymetazoline) twice daily for about 5 days and then discontinue.  Also recommend using saline nasal spray several times daily and saline nasal irrigation (AYR is a common brand).  Use Flonase nasal spray each morning after using Afrin nasal spray and saline nasal irrigation. Try warm salt water gargles for sore throat.  Stop all antihistamines for now, and other non-prescription cough/cold preparations. May take Ibuprofen 200mg , 4 tabs every 8 hours with food for chest/sternum discomfort, body aches, etc. Begin Azithromycin if not improving about one week or if persistent fever develops   Follow-up with family doctor if not improving about10 days.

## 2016-02-16 NOTE — ED Triage Notes (Signed)
5 days ago developed sore throat. Since developed productive cough, nasal congestion, low grade fever. Taken Mucinex otc

## 2018-04-01 ENCOUNTER — Ambulatory Visit (INDEPENDENT_AMBULATORY_CARE_PROVIDER_SITE_OTHER): Payer: BLUE CROSS/BLUE SHIELD | Admitting: Podiatry

## 2018-04-01 ENCOUNTER — Ambulatory Visit (INDEPENDENT_AMBULATORY_CARE_PROVIDER_SITE_OTHER): Payer: BLUE CROSS/BLUE SHIELD

## 2018-04-01 ENCOUNTER — Encounter: Payer: Self-pay | Admitting: Podiatry

## 2018-04-01 DIAGNOSIS — M21619 Bunion of unspecified foot: Secondary | ICD-10-CM

## 2018-04-01 DIAGNOSIS — M7752 Other enthesopathy of left foot: Secondary | ICD-10-CM | POA: Diagnosis not present

## 2018-04-01 DIAGNOSIS — M2042 Other hammer toe(s) (acquired), left foot: Secondary | ICD-10-CM

## 2018-04-01 DIAGNOSIS — M779 Enthesopathy, unspecified: Secondary | ICD-10-CM

## 2018-04-01 MED ORDER — TRIAMCINOLONE ACETONIDE 10 MG/ML IJ SUSP
10.0000 mg | Freq: Once | INTRAMUSCULAR | Status: AC
  Administered 2018-04-01: 10 mg

## 2018-04-01 NOTE — Progress Notes (Signed)
   Subjective:    Patient ID: Suzanne Welch, female    DOB: 11/18/1950, 68 y.o.   MRN: [id number]  HPI    Review of Systems  All other systems reviewed and are negative.      Objective:   Physical Exam        Assessment & Plan:

## 2018-04-01 NOTE — Patient Instructions (Signed)
Bunion  A bunion is a bump on the base of the big toe that forms when the bones of the big toe joint move out of position. Bunions may be small at first, but they often get larger over time. They can make walking painful. What are the causes? A bunion may be caused by:  Wearing narrow or pointed shoes that force the big toe to press against the other toes.  Abnormal foot development that causes the foot to roll inward (pronate).  Changes in the foot that are caused by certain diseases, such as rheumatoid arthritis or polio.  A foot injury. What increases the risk? The following factors may make you more likely to develop this condition:  Wearing shoes that squeeze the toes together.  Having certain diseases, such as: ? Rheumatoid arthritis. ? Polio. ? Cerebral palsy.  Having family members who have bunions.  Being born with a foot deformity, such as flat feet or low arches.  Doing activities that put a lot of pressure on the feet, such as ballet dancing. What are the signs or symptoms? The main symptom of a bunion is a noticeable bump on the big toe. Other symptoms may include:  Pain.  Swelling around the big toe.  Redness and inflammation.  Thick or hardened skin on the big toe or between the toes.  Stiffness or loss of motion in the big toe.  Trouble with walking. How is this diagnosed? A bunion may be diagnosed based on your symptoms, medical history, and activities. You may have tests, such as:  X-rays. These allow your health care provider to check the position of the bones in your foot and look for damage to your joint. They also help your health care provider determine the severity of your bunion and the best way to treat it.  Joint aspiration. In this test, a sample of fluid is removed from the toe joint. This test may be done if you are in a lot of pain. It helps rule out diseases that cause painful swelling of the joints, such as arthritis. How is this  treated? Treatment depends on the severity of your symptoms. The goal of treatment is to relieve symptoms and prevent the bunion from getting worse. Your health care provider may recommend:  Wearing shoes that have a wide toe box.  Using bunion pads to cushion the affected area.  Taping your toes together to keep them in a normal position.  Placing a device inside your shoe (orthotics) to help reduce pressure on your toe joint.  Taking medicine to ease pain, inflammation, and swelling.  Applying heat or ice to the affected area.  Doing stretching exercises.  Surgery to remove scar tissue and move the toes back into their normal position. This treatment is rare. Follow these instructions at home: Managing pain, stiffness, and swelling   If directed, put ice on the painful area: ? Put ice in a plastic bag. ? Place a towel between your skin and the bag. ? Leave the ice on for 20 minutes, 2-3 times a day. Activity   If directed, apply heat to the affected area before you exercise. Use the heat source that your health care provider recommends, such as a moist heat pack or a heating pad. ? Place a towel between your skin and the heat source. ? Leave the heat on for 20-30 minutes. ? Remove the heat if your skin turns bright red. This is especially important if you are unable to feel pain,   heat, or cold. You may have a greater risk of getting burned.  Do exercises as told by your health care provider. General instructions  Support your toe joint with proper footwear, shoe padding, or taping as told by your health care provider.  Take over-the-counter and prescription medicines only as told by your health care provider.  Keep all follow-up visits as told by your health care provider. This is important. Contact a health care provider if your symptoms:  Get worse.  Do not improve in 2 weeks. Get help right away if you have:  Severe pain and trouble with walking. Summary  A  bunion is a bump on the base of the big toe that forms when the bones of the big toe joint move out of position.  Bunions can make walking painful.  Treatment depends on the severity of your symptoms.  Support your toe joint with proper footwear, shoe padding, or taping as told by your health care provider. This information is not intended to replace advice given to you by your health care provider. Make sure you discuss any questions you have with your health care provider. Document Released: 02/15/2005 Document Revised: 06/28/2017 Document Reviewed: 06/28/2017 Elsevier Interactive Patient Education  2019 [employer]  

## 2018-04-10 NOTE — Progress Notes (Signed)
Subjective:   Patient ID: Suzanne Welch, female   DOB: 68 y.o.   MRN: [id number]   HPI Patient presents stating she has a bunion on the left foot that is really been bothering her and her toes of the third and fourth toes are informed to make it hard for her to be ambulatory and comfortable.  Patient states this is been going on for a while and gradually becoming more irritated for her and patient does not smoke and likes to be active   Review of Systems  All other systems reviewed and are negative.       Objective:  Physical Exam Vitals signs and nursing note reviewed.  Constitutional:      Appearance: She is well-developed.  Pulmonary:     Effort: Pulmonary effort is normal.  Musculoskeletal: Normal range of motion.  Skin:    General: Skin is warm.  Neurological:     Mental Status: She is alert.     Neurovascular status intact with muscle strength adequate range of motion within normal limits with patient found to have large structural bunion deformity left with redness around the first metatarsal head and inflammation pain between the third and fourth toes left foot with fluid buildup at the inner phalangeal joint and pain with palpation with keratotic tissue formation.  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  Patient will reappoint in the next 3 weeks and today proximal nerve block done I then injected the fourth interphalangeal joint with 1 mg dexamethasone and debrided lesion and applied padding and reappoint to recheck  X-ray indicates structural bunion deformity with digital deformities noted that are localized

## 2018-04-14 ENCOUNTER — Ambulatory Visit (INDEPENDENT_AMBULATORY_CARE_PROVIDER_SITE_OTHER): Payer: BLUE CROSS/BLUE SHIELD | Admitting: Podiatry

## 2018-04-14 ENCOUNTER — Encounter: Payer: Self-pay | Admitting: Podiatry

## 2018-04-14 DIAGNOSIS — M21619 Bunion of unspecified foot: Secondary | ICD-10-CM | POA: Diagnosis not present

## 2018-04-14 DIAGNOSIS — M2042 Other hammer toe(s) (acquired), left foot: Secondary | ICD-10-CM

## 2018-04-14 NOTE — Patient Instructions (Signed)
Pre-Operative Instructions  Congratulations, you have decided to take an important step towards improving your quality of life.  You can be assured that the doctors and staff at [facility] will be with you every step of the way.  Here are some important things you should know:  1. Plan to be at the surgery center/hospital at least 1 (one) hour prior to your scheduled time, unless otherwise directed by the surgical center/hospital staff.  You must have a responsible adult accompany you, remain during the surgery and drive you home.  Make sure you have directions to the surgical center/hospital to ensure you arrive on time. 2. If you are having surgery at [facility] or [facility], you will need a copy of your medical history and physical form from your family physician within one month prior to the date of surgery. We will give you a form for your primary physician to complete.  3. We make every effort to accommodate the date you request for surgery.  However, there are times where surgery dates or times have to be moved.  We will contact you as soon as possible if a change in schedule is required.   4. No aspirin/ibuprofen for one week before surgery.  If you are on aspirin, any non-steroidal anti-inflammatory medications (Mobic, Aleve, Ibuprofen) should not be taken seven (7) days prior to your surgery.  You make take Tylenol for pain prior to surgery.  5. Medications - If you are taking daily heart and blood pressure medications, seizure, reflux, allergy, asthma, anxiety, pain or diabetes medications, make sure you notify the surgery center/hospital before the day of surgery so they can tell you which medications you should take or avoid the day of surgery. 6. No food or drink after midnight the night before surgery unless directed otherwise by surgical center/hospital staff. 7. No alcoholic beverages 24-hours prior to surgery.  No smoking 24-hours prior or 24-hours after  surgery. 8. Wear loose pants or shorts. They should be loose enough to fit over bandages, boots, and casts. 9. Don't wear slip-on shoes. Sneakers are preferred. 10. Bring your boot with you to the surgery center/hospital.  Also bring crutches or a walker if your physician has prescribed it for you.  If you do not have this equipment, it will be provided for you after surgery. 11. If you have not been contacted by the surgery center/hospital by the day before your surgery, call to confirm the date and time of your surgery. 12. Leave-time from work may vary depending on the type of surgery you have.  Appropriate arrangements should be made prior to surgery with your employer. 13. Prescriptions will be provided immediately following surgery by your doctor.  Fill these as soon as possible after surgery and take the medication as directed. Pain medications will not be refilled on weekends and must be approved by the doctor. 14. Remove nail polish on the operative foot and avoid getting pedicures prior to surgery. 15. Wash the night before surgery.  The night before surgery wash the foot and leg well with water and the antibacterial soap provided. Be sure to pay special attention to beneath the toenails and in between the toes.  Wash for at least three (3) minutes. Rinse thoroughly with water and dry well with a towel.  Perform this wash unless told not to do so by your physician.  Enclosed: 1 Ice pack (please put in freezer the night before surgery)   1 Hibiclens skin cleaner     Pre-op instructions  If you have any questions regarding the instructions, please do not hesitate to call our office.  [place]: [address], [place], [place] [address] -- [phone]  [place]: [address] [place], [place] [address] -- [phone]  [place]: [address]  [place], [place] [address] -- [phone]  [place]: [address], [address], [place], [place] [address] -- [phone]  Website:  [email or web address] 

## 2018-04-16 NOTE — Progress Notes (Signed)
Subjective:   Patient ID: Suzanne Welch, female   DOB: 68 y.o.   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  I explained we will not get full correction of the bunion deformity but I do think with her age and clinical presentation this will give her good result and I did explain radiographically will not have full correction and I offered her [place]us with.  Nonweightbearing and she denies wanting this.  I allowed her to read consent form going over all possible complications and the fact recovery will be 6 months to 1 year and after extensive review she signed consent form and is dispensed air fracture walker with all instructions on usage.  She is scheduled for outpatient surgery and is encouraged to call with any questions concerns she may have

## 2018-05-02 ENCOUNTER — Encounter: Payer: Self-pay | Admitting: Podiatry

## 2018-05-02 DIAGNOSIS — M2012 Hallux valgus (acquired), left foot: Secondary | ICD-10-CM

## 2018-05-02 DIAGNOSIS — M2042 Other hammer toe(s) (acquired), left foot: Secondary | ICD-10-CM

## 2018-05-08 ENCOUNTER — Encounter: Payer: Self-pay | Admitting: Podiatry

## 2018-05-08 ENCOUNTER — Ambulatory Visit (INDEPENDENT_AMBULATORY_CARE_PROVIDER_SITE_OTHER): Payer: BLUE CROSS/BLUE SHIELD

## 2018-05-08 ENCOUNTER — Ambulatory Visit (INDEPENDENT_AMBULATORY_CARE_PROVIDER_SITE_OTHER): Payer: BLUE CROSS/BLUE SHIELD | Admitting: Podiatry

## 2018-05-08 VITALS — Temp 96.9°F

## 2018-05-08 DIAGNOSIS — M21619 Bunion of unspecified foot: Secondary | ICD-10-CM

## 2018-05-08 DIAGNOSIS — M2042 Other hammer toe(s) (acquired), left foot: Secondary | ICD-10-CM

## 2018-05-10 NOTE — Progress Notes (Signed)
Subjective:   Patient ID: Suzanne Welch, female   DOB: 68 y.o.   MRN: [id number]   HPI Patient states doing well with surgery with mild discomfort if she is on her foot for too long of a time   ROS      Objective:  Physical Exam  Neurovascular status intact negative Homans sign was noted wound edges well coapted left with incisions intact stitches intact and good correction of the first MPJ with motion that is normal for this.  Postop     Assessment:  Doing well post forefoot reconstruction left     Plan:  H&P conditions reviewed x-rays reviewed with patient and today sterile dressing reapplied gave instructions for continued elevation compression immobilization and reappoint 2 weeks or earlier if needed  X-rays indicate osteotomies healing well joint congruence digits in good alignment with satisfactory resection of bone

## 2018-05-22 ENCOUNTER — Encounter: Payer: Self-pay | Admitting: Podiatry

## 2018-05-22 ENCOUNTER — Ambulatory Visit (INDEPENDENT_AMBULATORY_CARE_PROVIDER_SITE_OTHER): Payer: Self-pay | Admitting: Podiatry

## 2018-05-22 ENCOUNTER — Ambulatory Visit (INDEPENDENT_AMBULATORY_CARE_PROVIDER_SITE_OTHER): Payer: BLUE CROSS/BLUE SHIELD

## 2018-05-22 ENCOUNTER — Other Ambulatory Visit: Payer: Self-pay

## 2018-05-22 DIAGNOSIS — M2012 Hallux valgus (acquired), left foot: Secondary | ICD-10-CM | POA: Diagnosis not present

## 2018-05-22 DIAGNOSIS — M2042 Other hammer toe(s) (acquired), left foot: Secondary | ICD-10-CM

## 2018-05-22 NOTE — Progress Notes (Signed)
Subjective:   Patient ID: Suzanne Welch, female   DOB: 68 y.o.   MRN: [id number]   HPI Patient states overall doing pretty well with some pain in the outside of the foot that she wanted to have checked but overall with the bunion was done and the toe seems to be doing well   ROS      Objective:  Physical Exam  Neurovascular status intact negative Homans sign was noted with patient found to have well-healed surgical sites left first metatarsal and third fourth and fifth digits left foot with wound edges coapted toes and good position     Assessment:  Doing well post osteotomy left hammertoe repair with patient being moderately active on the foot     Plan:  H&P x-rays reviewed condition discussed.  Some of the stitches removed with the rest to be removed in 2 weeks and I discussed slight stress on the osteotomy site and for her to continue with immobilization at this current time and range of motion exercises.  We will reevaluate again in 3 weeks and until that time she will continue immobilization compression elevation  X-rays indicate that the osteotomy is healing well fixation is in place with the possibility of a small crack line but if it is present it is minimal and should not be a problem but I will continue to monitor and she will continue immobilization

## 2018-06-05 ENCOUNTER — Telehealth: Payer: Self-pay | Admitting: Podiatry

## 2018-06-05 ENCOUNTER — Encounter: Payer: Self-pay | Admitting: Podiatry

## 2018-06-05 ENCOUNTER — Other Ambulatory Visit: Payer: Self-pay

## 2018-06-05 ENCOUNTER — Ambulatory Visit (INDEPENDENT_AMBULATORY_CARE_PROVIDER_SITE_OTHER): Payer: Self-pay

## 2018-06-05 ENCOUNTER — Ambulatory Visit (INDEPENDENT_AMBULATORY_CARE_PROVIDER_SITE_OTHER): Payer: Self-pay | Admitting: Podiatry

## 2018-06-05 VITALS — Temp 97.3°F

## 2018-06-05 DIAGNOSIS — M2012 Hallux valgus (acquired), left foot: Secondary | ICD-10-CM

## 2018-06-05 NOTE — Progress Notes (Signed)
Subjective:   Patient ID: Suzanne Welch, female   DOB: 68 y.o.   MRN: [id number]   HPI Patient states overall doing really well and just being careful not to do too much and is still wearing her boot for protection   ROS      Objective:  Physical Exam  Neurovascular status intact negative Homans sign noted with patient's left foot healing well with wound edges well coapted hallux in rectus position digits in good alignment with wound edges well coapted     Assessment:  Doing well post forefoot reconstruction left     Plan:  H&P condition reviewed and recommended continued range of motion exercises gradual return to surgical shoe soft shoe and stitches removed today with wound edges staying well coapted.  Patient will be seen back to recheck again 4 weeks or earlier if needed and will gradually increase activity during that time  X-ray indicates the osteotomy is healing well there is been slight movement in a dorsal direction but it is stable and should not be a problem from this point conveyed to patient

## 2018-06-05 NOTE — Telephone Encounter (Signed)
Pt is scheduled to come in today to have stitches removed and was wondering if she could take some type of pain medication before her appt to help with the pain of having them removed. Please give patient a call. Pts appt is at 11:30am.

## 2018-06-08 ENCOUNTER — Telehealth: Payer: Self-pay | Admitting: Podiatry

## 2018-06-08 NOTE — Telephone Encounter (Signed)
Pt states the surgery site rubs on the areas the sutures have been removed, does she need to apply neosporin to the areas. I told pt to pad off the areas with gause and the extra socks and decrease activity, RICE therapy and OTC NSAIDS if tolerates take as package instructs. Pt states understanding and thanked me for the instructions.

## 2018-07-03 ENCOUNTER — Encounter: Payer: Self-pay | Admitting: Podiatry

## 2018-07-03 ENCOUNTER — Other Ambulatory Visit: Payer: Self-pay

## 2018-07-03 ENCOUNTER — Ambulatory Visit: Payer: Medicare Other | Admitting: Podiatry

## 2018-07-03 ENCOUNTER — Ambulatory Visit (INDEPENDENT_AMBULATORY_CARE_PROVIDER_SITE_OTHER): Payer: Medicare Other

## 2018-07-03 DIAGNOSIS — M2012 Hallux valgus (acquired), left foot: Secondary | ICD-10-CM

## 2018-07-03 DIAGNOSIS — M2042 Other hammer toe(s) (acquired), left foot: Secondary | ICD-10-CM

## 2018-07-03 NOTE — Progress Notes (Signed)
Subjective:   Patient ID: Suzanne Welch, female   DOB: 68 y.o.   MRN: [id number]   HPI Patient states her foot continues to improve and she is walking more more and still does get some swelling if she is been on it too long   ROS      Objective:  Physical Exam  Neurovascular status intact negative Homans sign noted wound edges well coapted left foot with good alignment good range of motion no crepitus of the joint     Assessment:  Doing well post osteotomy digital procedures 345 left     Plan:  H&P x-rays reviewed advised patient to gradually increase activities and advised on increased utilization of shoe gear.  Reappoint for [place] to recheck  X-ray indicates that the osteotomy is healing well fixation in place and reduction of the intermetatarsal angle noted

## 2018-08-28 ENCOUNTER — Ambulatory Visit (INDEPENDENT_AMBULATORY_CARE_PROVIDER_SITE_OTHER): Payer: Medicare Other

## 2018-08-28 ENCOUNTER — Encounter: Payer: Self-pay | Admitting: Podiatry

## 2018-08-28 ENCOUNTER — Other Ambulatory Visit: Payer: Self-pay

## 2018-08-28 ENCOUNTER — Ambulatory Visit: Payer: Medicare Other | Admitting: Podiatry

## 2018-08-28 ENCOUNTER — Other Ambulatory Visit: Payer: Self-pay | Admitting: Podiatry

## 2018-08-28 VITALS — Temp 98.5°F

## 2018-08-28 DIAGNOSIS — M79672 Pain in left foot: Secondary | ICD-10-CM

## 2018-08-28 DIAGNOSIS — M2012 Hallux valgus (acquired), left foot: Secondary | ICD-10-CM

## 2018-08-29 NOTE — Progress Notes (Signed)
Subjective:   Patient ID: Suzanne Welch, female   DOB: 68 y.o.   MRN: [id number]   HPI Patient states still getting mild swelling but overall is doing very well with the left foot   ROS      Objective:  Physical Exam  Neurovascular status intact negative Homans sign noted wound edges well coapted and lesions between toes completely resolved with mild edema still noted of the digits     Assessment:  Overall doing well with edema still noted which is consistent with healing process     Plan:  H&P x-rays reviewed and advised on gradual return to normal activities and swelling should continue to reduce  X-rays indicate osteotomies are healing well good alignment is noted

## 2020-03-12 ENCOUNTER — Ambulatory Visit: Payer: Medicare Other | Admitting: Dermatology

## 2020-03-12 ENCOUNTER — Encounter: Payer: Self-pay | Admitting: Dermatology

## 2020-03-12 ENCOUNTER — Other Ambulatory Visit: Payer: Self-pay

## 2020-03-12 DIAGNOSIS — R21 Rash and other nonspecific skin eruption: Secondary | ICD-10-CM | POA: Diagnosis not present

## 2020-03-12 DIAGNOSIS — Z85828 Personal history of other malignant neoplasm of skin: Secondary | ICD-10-CM | POA: Diagnosis not present

## 2020-03-12 DIAGNOSIS — L821 Other seborrheic keratosis: Secondary | ICD-10-CM

## 2020-03-12 DIAGNOSIS — Z1283 Encounter for screening for malignant neoplasm of skin: Secondary | ICD-10-CM

## 2020-03-12 DIAGNOSIS — L409 Psoriasis, unspecified: Secondary | ICD-10-CM

## 2020-03-12 MED ORDER — CLOBETASOL PROPIONATE 0.05 % EX FOAM: Freq: Two times a day (BID) | CUTANEOUS | 3 refills | Status: AC

## 2020-03-12 MED ORDER — METRONIDAZOLE 0.75 % EX GEL: 1.0000 "application " | Freq: Every day | CUTANEOUS | 1 refills | Status: AC

## 2020-03-16 ENCOUNTER — Encounter: Payer: Self-pay | Admitting: Dermatology

## 2020-03-16 NOTE — Progress Notes (Signed)
   Follow-Up Visit   Subjective  Suzanne Welch is a 70 y.o. female who presents for the following: Annual Exam (Face- few crusty spots).  Psoriasis Location: Scalp Duration:  Quality:  Associated Signs/Symptoms: Modifying Factors:  Severity:  Timing: Context: Also would like general skin check including crusts on face  Objective  Well appearing patient in no apparent distress; mood and affect are within normal limits. Objective  Mid Tip of Nose: No sign recurrence but half dozen inflammatory papules suggestive of rosacea  Objective  Left Elbow - Posterior, Left Hip (side) - Posterior, Neck - Posterior, Right Hip (side) - Posterior: Rash from covid shot on buttocks (resolved), but history of breaking out elbows and post neck comes and goes for years  Objective  Right Thigh - Anterior: Light brown textured 4 mm flattopped papules each leg  Objective  Chest - Medial (Center): Back and all sun exposed areas examined; no atypical moles, melanoma, nonmole skin cancer.  Objective  Mid Occipital Scalp: Localized moderately thick scale.  No involvement ears, elbows, nails.    A full examination was performed including scalp, head, eyes, ears, nose, lips, neck, chest, axillae, abdomen, back, buttocks, bilateral upper extremities, bilateral lower extremities, hands, feet, fingers, toes, fingernails, and toenails. All findings within normal limits unless otherwise noted below.   Assessment & Plan    History of basal cell cancer Mid Tip of Nose  Yearly skin check, treat rosacea with topical metronidazole  metroNIDAZOLE (METROGEL) 0.75 % gel - Mid Tip of Nose  Rash and other nonspecific skin eruption (4) Left Elbow - Posterior; Neck - Posterior; Left Hip (side) - Posterior; Right Hip (side) - Posterior  May use the topical clobetasol on a prn basis, avoid face and body folds.  Seborrheic keratosis Right Thigh - Anterior  Leave if stable  Screening exam for skin  cancer Chest - Medial [facility])  Annual skin examination  Psoriasis Mid Occipital Scalp  If out-of-pocket is not to high, will use clobetasol foam.  If improves after daily use for a couple of weeks she will try tapering the use.  Avoid use on face.  clobetasol (OLUX) 0.05 % topical foam - Mid Occipital Scalp      I, Lavonna Monarch, MD, have reviewed all documentation for this visit.  The documentation on 03/16/20 for the exam, diagnosis, procedures, and orders are all accurate and complete.

## 2020-05-20 ENCOUNTER — Ambulatory Visit: Payer: Medicare Other | Admitting: Dermatology

## 2020-05-20 ENCOUNTER — Encounter: Payer: Self-pay | Admitting: Dermatology

## 2020-05-20 ENCOUNTER — Other Ambulatory Visit: Payer: Self-pay

## 2020-05-20 DIAGNOSIS — L578 Other skin changes due to chronic exposure to nonionizing radiation: Secondary | ICD-10-CM | POA: Diagnosis not present

## 2020-05-20 DIAGNOSIS — L409 Psoriasis, unspecified: Secondary | ICD-10-CM | POA: Diagnosis not present

## 2020-05-20 DIAGNOSIS — L719 Rosacea, unspecified: Secondary | ICD-10-CM | POA: Diagnosis not present

## 2020-05-20 DIAGNOSIS — Z85828 Personal history of other malignant neoplasm of skin: Secondary | ICD-10-CM

## 2020-05-20 NOTE — Progress Notes (Signed)
clobetasol

## 2020-05-20 NOTE — Patient Instructions (Addendum)
[email or web address]  ?

## 2020-05-31 ENCOUNTER — Encounter: Payer: Self-pay | Admitting: Dermatology

## 2020-05-31 NOTE — Progress Notes (Signed)
   Follow-Up Visit   Subjective  Suzanne Welch is a 70 y.o. female who presents for the following: Follow-up (Follow up. Per patient she has rosacea on face and psoriasis (elbows, back of neck, now on arms). No flare up. Per patient doesn't remember which medication was for which, but said the clobetasol seemed to work. ).  Psoriasis and rosacea Location:  Duration:  Quality:  Associated Signs/Symptoms: Modifying Factors:  Severity:  Timing: Context:   Objective  Well appearing patient in no apparent distress; mood and affect are within normal limits. Objective  Mid Supratip of Nose, Right Forearm - Anterior: Scar clear  Objective  Left Forearm - Anterior, Left Upper Arm - Anterior, Mid Occipital Scalp, Right Antecubital Fossa, Right Forearm - Anterior: Pink 1 to 2 cm plaques; moderate itching.  Objective  Left Buccal Cheek, Right Buccal Cheek: Patchy central facial erythema without pustules  Objective  Right Buccal Cheek: Mixture of erythema from UV damage, COVID mask, and perhaps rosacea.    A focused examination was performed including Head, neck, arms.. Relevant physical exam findings are noted in the Assessment and Plan.   Assessment & Plan    History of basal cell carcinoma (BCC) (2) Right Forearm - Anterior; Mid Supratip of Nose  Yearly skin check  Psoriasis (5) Right Antecubital Fossa; Left Upper Arm - Anterior; Left Forearm - Anterior; Right Forearm - Anterior; Mid Occipital Scalp  May use the same clobetasol foam she has for scalp on arms.  Avoid use on face and body folds.  Other Related Medications clobetasol (OLUX) 0.05 % topical foam  Rosacea (2) Left Buccal Cheek; Right Buccal Cheek  Will contact me for topical therapy if worsens.  Sun-damaged skin Right Buccal Cheek  No intervention initiated.      I, Lavonna Monarch, MD, have reviewed all documentation for this visit.  The documentation on 05/31/20 for the exam, diagnosis,  procedures, and orders are all accurate and complete.

## 2020-12-22 ENCOUNTER — Ambulatory Visit: Payer: Medicare Other | Admitting: Dermatology

## 2021-03-10 ENCOUNTER — Telehealth: Payer: Self-pay

## 2021-03-10 ENCOUNTER — Ambulatory Visit: Payer: Medicare Other | Admitting: Dermatology

## 2021-03-10 ENCOUNTER — Other Ambulatory Visit: Payer: Self-pay

## 2021-03-10 DIAGNOSIS — L570 Actinic keratosis: Secondary | ICD-10-CM | POA: Diagnosis not present

## 2021-03-10 DIAGNOSIS — L409 Psoriasis, unspecified: Secondary | ICD-10-CM

## 2021-03-10 DIAGNOSIS — L821 Other seborrheic keratosis: Secondary | ICD-10-CM

## 2021-03-10 DIAGNOSIS — Z5181 Encounter for therapeutic drug level monitoring: Secondary | ICD-10-CM

## 2021-03-10 DIAGNOSIS — Z1283 Encounter for screening for malignant neoplasm of skin: Secondary | ICD-10-CM | POA: Diagnosis not present

## 2021-03-10 NOTE — Telephone Encounter (Signed)
Phone call from patient stating that she saw Dr. Denna Haggard this morning and she was told by Dr. Denna Haggard that on her after visit summary there would be information regarding a new injection he would like her to read up on and think about trying.  Patient states that on her after visit summary non of that information was listed and she wanted to know what to do?  I asked patient if she had my chart and if so then the information would be in her my chart. Patient states that she has the information to sign up for my chart and she will set it up so she can read up on the injection.

## 2021-03-10 NOTE — Patient Instructions (Addendum)
[email or web address]  Risankizumab Injection What is this medication? RISANKIZUMAB (RIS an KIZ ue mab) reduces skin swelling, redness, itching, or rashes caused by psoriasis. It also treats psoriatic arthritis and Crohn's disease. It works on the immune system to reduce inflammation in the body. This medicine may be used for other purposes; ask your health care provider or pharmacist if you have questions. COMMON BRAND NAME(S): Skyrizi What should I tell my care team before I take this medication? They need to know if you have any of these conditions: Hepatic disease Immune system problems Infection such as tuberculosis (TB) or other bacterial, fungal or viral infections Recent or upcoming vaccine An unusual or allergic reaction to risankizumab, other medications, foods, dyes or preservatives Pregnant or trying to get pregnant Breast-feeding How should I use this medication? This medication is injected into a vein or under the skin. It is administered by someone from your care team. It may also be given at home. If you get this medication at home, you will be taught how to prepare and give it. Take it as directed on the prescription label. Keep taking it unless your care team tells you to stop. If you use a pen, be sure to take off the outer needle cover before using the dose. It is important that you put your used needles and syringes in a special sharps container. Do not put them in a trash can. If you do not have a sharps container, call your pharmacist or care team to get one. A special MedGuide will be given to you by the pharmacist with each prescription and refill. Be sure to read this information carefully each time. This medication comes with INSTRUCTIONS FOR USE. Ask your pharmacist for directions on how to use this medication. Read the information carefully. Talk to your pharmacist or care team if you have questions. Talk to your care team about the use of this medication in children.  Special care may be needed. Overdosage: If you think you have taken too much of this medicine contact a poison control center or emergency room at once. NOTE: This medicine is only for you. Do not share this medicine with others. What if I miss a dose? It is important not to miss any doses. Talk to your care team about what to do if you miss a dose. What may interact with this medication? Do not take this medication with any of the following: Live vaccines This list may not describe all possible interactions. Give your health care provider a list of all the medicines, herbs, non-prescription drugs, or dietary supplements you use. Also tell them if you smoke, drink alcohol, or use illegal drugs. Some items may interact with your medicine. What should I watch for while using this medication? Visit your care team for regular checks on your progress. Tell your care team if your symptoms do not start to get better or if they get worse. You will be tested for tuberculosis (TB) before you start this medication. If your care team prescribes any medication for TB, you should start taking the TB medication before starting this medication. Make sure to finish the full course of TB medication. This medication may increase your risk of getting an infection. Call your care team for advice if you get a fever chills, sore throat, or other symptoms of a cold or flu. Do not treat yourself. Try to avoid being around people who are sick. This medication can decrease the response to a vaccine. If you need  to get vaccinated, tell your care team if you have received this medication. Extra booster doses may be needed. Talk to your care team to see if a different vaccination schedule is needed. What side effects may I notice from receiving this medication? Side effects that you should report to your care team as soon as possible: Allergic reactions--skin rash, itching, hives, swelling of the face, lips, tongue, or  throat Infection--fever, chills, cough, sore throat, wounds that don't heal, pain or trouble when passing urine, general feeling of discomfort or being unwell Liver injury--dark yellow or brown urine, general ill feeling or flu-like symptoms, loss of appetite, right upper belly pain, unusually weak or tired, yellowing of the eyes or skin Low red blood cell counts--trouble breathing, feeling faint, lightheaded, falls, unusually weak or tired Skin infection--skin redness, swelling, warmth, or pain Side effects that usually do not require medical attention (report these to your care team if they continue or are bothersome): Back pain Fatigue Headache Joint pain Pain, redness, or irritation at injection site Stomach pain This list may not describe all possible side effects. Call your doctor for medical advice about side effects. You may report side effects to FDA at 1-800-FDA-1088. Where should I keep my medication? Keep out of the reach of children and pets. Store in a refrigerator. Do not freeze. Protect from light. Keep it in the original carton until you are ready to take it. See product for storage information. Each product may have different instructions. Remove the dose from the carton about 30 to 45 minutes before it is time for you to take it. Get rid of any unused medication after the expiration date. To get rid of medications that are no longer needed or have expired: Take the medication to a medication take-back program. Check with your pharmacy or law enforcement to find a location. If you cannot return the medication, ask your pharmacist or care team how to get rid of this medicine safely. NOTE: This sheet is a summary. It may not cover all possible information. If you have questions about this medicine, talk to your doctor, pharmacist, or health care provider.  2022 Elsevier/Gold Standard (2020-08-15 00:00:00)

## 2021-03-17 LAB — QUANTIFERON-TB GOLD PLUS
Mitogen-NIL: >10 IU/mL
NIL: 0.08 IU/mL
QuantiFERON-TB Gold Plus: NEGATIVE
TB1-NIL: 0.09 IU/mL
TB2-NIL: 0.06 IU/mL

## 2021-03-17 LAB — COMPREHENSIVE METABOLIC PANEL
AG Ratio: 1.9 (calc) (ref 1.0–2.5)
ALT: 83 U/L — ABNORMAL HIGH (ref 6–29)
AST: 54 U/L — ABNORMAL HIGH (ref 10–35)
Albumin: 4.4 g/dL (ref 3.6–5.1)
Alkaline phosphatase (APISO): 69 U/L (ref 37–153)
BUN: 20 mg/dL (ref 7–25)
CO2: 29 mmol/L (ref 20–32)
Calcium: 9.9 mg/dL (ref 8.6–10.4)
Chloride: 106 mmol/L (ref 98–110)
Creat: 0.68 mg/dL (ref 0.50–1.05)
Globulin: 2.3 g/dL (calc) (ref 1.9–3.7)
Glucose, Bld: 81 mg/dL (ref 65–99)
Potassium: 4.4 mmol/L (ref 3.5–5.3)
Sodium: 142 mmol/L (ref 135–146)
Total Bilirubin: 0.5 mg/dL (ref 0.2–1.2)
Total Protein: 6.7 g/dL (ref 6.1–8.1)

## 2021-03-17 LAB — CBC WITH DIFFERENTIAL/PLATELET
Absolute Monocytes: 639 cells/uL (ref 200–950)
Basophils Absolute: 61 cells/uL (ref 0–200)
Basophils Relative: 0.9 %
Eosinophils Absolute: 27 cells/uL (ref 15–500)
Eosinophils Relative: 0.4 %
HCT: 45.7 % — ABNORMAL HIGH (ref 35.0–45.0)
Hemoglobin: 15.7 g/dL — ABNORMAL HIGH (ref 11.7–15.5)
Lymphs Abs: 2264 cells/uL (ref 850–3900)
MCH: 30.7 pg (ref 27.0–33.0)
MCHC: 34.4 g/dL (ref 32.0–36.0)
MCV: 89.4 fL (ref 80.0–100.0)
MPV: 10.2 fL (ref 7.5–12.5)
Monocytes Relative: 9.4 %
Neutro Abs: 3808 cells/uL (ref 1500–7800)
Neutrophils Relative %: 56 %
Platelets: 346 10*3/uL (ref 140–400)
RBC: 5.11 10*6/uL — ABNORMAL HIGH (ref 3.80–5.10)
RDW: 12 % (ref 11.0–15.0)
Total Lymphocyte: 33.3 %
WBC: 6.8 10*3/uL (ref 3.8–10.8)

## 2021-03-17 LAB — ANTI-NUCLEAR AB-TITER (ANA TITER): ANA Titer 1: 1:40 {titer} — ABNORMAL HIGH

## 2021-03-17 LAB — HEPATITIS B CORE ANTIBODY, TOTAL: Hep B Core Total Ab: NONREACTIVE

## 2021-03-17 LAB — HEPATITIS C ANTIBODY
Hepatitis C Ab: NONREACTIVE
SIGNAL TO CUT-OFF: 0.08 (ref <1.00)

## 2021-03-17 LAB — HEPATITIS B SURFACE ANTIGEN: Hepatitis B Surface Ag: NONREACTIVE

## 2021-03-17 LAB — ANA: Anti Nuclear Antibody (ANA): POSITIVE — AB

## 2021-03-17 LAB — HEPATITIS B SURFACE ANTIBODY, QUANTITATIVE: Hepatitis B-Post: >1000 m[IU]/mL (ref >=10)

## 2021-03-18 ENCOUNTER — Telehealth: Payer: Self-pay | Admitting: *Deleted

## 2021-03-18 NOTE — Telephone Encounter (Signed)
Lab results to patient- told to follow up with primary care doctor per dr tafeen. New skyrizi prescription sent to [facility] via the portal.

## 2021-03-18 NOTE — Telephone Encounter (Signed)
-----   Message from Lavonna Monarch, MD sent at 03/18/2021  4:08 AM EST ----- Please let patient know that labs showed a modest increase in what are called liver enzymes.  This will not keep her from beginning biologic therapy but it is something that she should have a follow-up with her primary care doctor in the next 1 to 2 months.

## 2021-03-18 NOTE — Telephone Encounter (Signed)
Faxed over [facility] and skyrizi complete forms.

## 2021-03-18 NOTE — Telephone Encounter (Signed)
Lab results to patient- skyrizi sent to [facility] via [facility] portal. New start skyrizi sent via [facility] portal.

## 2021-03-23 ENCOUNTER — Telehealth: Payer: Self-pay | Admitting: *Deleted

## 2021-03-23 NOTE — Telephone Encounter (Signed)
Received fax from [facility]- stating they will complete their review and contact us with results for assistance with skyrizi. If patient is eligible for program and request shipment to our office they will contact us to schedule delivery.

## 2021-03-30 ENCOUNTER — Telehealth: Payer: Self-pay

## 2021-03-30 NOTE — Telephone Encounter (Signed)
Fax received from [facility] stating that they spoke with the patient and per the patient's request they have discontinued the patient's medication.

## 2021-04-03 ENCOUNTER — Encounter: Payer: Self-pay | Admitting: Dermatology

## 2021-04-03 NOTE — Progress Notes (Signed)
° °  Follow-Up Visit   Subjective  Shoshanna Mcquitty is a 71 y.o. female who presents for the following: Annual Exam (Here for skin exam. Concerns Patient has psoriasis. It has been flaring up more all over.  We gave her clobetasol foam, but its not helping anymore. It is really bothering patient /Patient also has red lesion on corner of eye right side. It is tender and crusty. Not healing. ).  General skin check and psoriasis significantly flaring Location:  Duration:  Quality:  Associated Signs/Symptoms: Modifying Factors:  Severity:  Timing: Context:   Objective  Well appearing patient in no apparent distress; mood and affect are within normal limits. Generalized, Mid Back Full body skin check: No atypical pigmented lesions or nonmelanoma skin cancer  Mid Back Significant flare with negative impact on patient's quality of life.  Perhaps 20% BSA small plaque Psoriasis with secondary lichenification.  Right Lower Back 9 tan flattopped textured 5 mm papule  Mid Tip of Nose, Right Zygomatic Area Gritty 5 mm crusts    A full examination was performed including scalp, head, eyes, ears, nose, lips, neck, chest, axillae, abdomen, back, buttocks, bilateral upper extremities, bilateral lower extremities, hands, feet, fingers, toes, fingernails, and toenails. All findings within normal limits unless otherwise noted below.  Areas beneath undergarments not fully examined.   Assessment & Plan    Screening for malignant neoplasm of skin (2) Mid Back; Generalized  Annual skin examination, patient encouraged to self examine twice annually.  Psoriasis Mid Back  Possible skyrizi new start  Related Procedures Comprehensive metabolic panel CBC with Differential/Platelet Hepatitis B surface antibody,quantitative Hepatitis B surface antigen Hepatitis B core antibody, total Hepatitis C antibody QuantiFERON-TB Gold Plus ANA Anti-nuclear ab-titer (ANA titer)  Related  Medications clobetasol (OLUX) 0.05 % topical foam Apply topically 2 (two) times daily.  Seborrheic keratosis Right Lower Back  Leave if stable  Encounter for therapeutic drug monitoring  Related Procedures Comprehensive metabolic panel CBC with Differential/Platelet Hepatitis B surface antibody,quantitative Hepatitis B surface antigen Hepatitis B core antibody, total Hepatitis C antibody QuantiFERON-TB Gold Plus ANA  Related Medications albuterol (VENTOLIN HFA) 108 (90 Base) MCG/ACT inhaler Inhale into the lungs.  Ascorbic Acid 500 MG CHEW   Cholecalciferol 125 MCG (5000 UT) TABS Take 1 tablet by mouth daily.  Multiple Vitamins-Minerals (MULTIVITAMIN WITH MINERALS) tablet Take 1 tablet by mouth daily.  Zinc 30 MG CAPS   AK (actinic keratosis) (2) Mid Tip of Nose; Right Zygomatic Area  Patient defers freeze; will return if there is growth or bleeding.  Destruction of lesion - Mid Tip of Nose      I, Lavonna Monarch, MD, have reviewed all documentation for this visit.  The documentation on 04/03/21 for the exam, diagnosis, procedures, and orders are all accurate and complete.

## 2021-04-30 ENCOUNTER — Encounter: Payer: Self-pay | Admitting: Dermatology

## 2021-04-30 ENCOUNTER — Other Ambulatory Visit: Payer: Self-pay | Admitting: *Deleted

## 2021-04-30 MED ORDER — CLOBETASOL PROP EMOLLIENT BASE 0.05 % EX CREA: 1.0000 "application " | TOPICAL_CREAM | Freq: Two times a day (BID) | CUTANEOUS | 1 refills | Status: AC

## 2021-06-02 ENCOUNTER — Encounter: Payer: Self-pay | Admitting: Dermatology

## 2021-06-02 ENCOUNTER — Ambulatory Visit: Payer: Medicare Other | Admitting: Dermatology

## 2021-06-02 DIAGNOSIS — Z85828 Personal history of other malignant neoplasm of skin: Secondary | ICD-10-CM | POA: Diagnosis not present

## 2021-06-02 DIAGNOSIS — L858 Other specified epidermal thickening: Secondary | ICD-10-CM

## 2021-06-02 DIAGNOSIS — L570 Actinic keratosis: Secondary | ICD-10-CM

## 2021-06-02 MED ORDER — IMIQUIMOD 5 % EX CREA: TOPICAL_CREAM | Freq: Every day | CUTANEOUS | 0 refills | Status: AC

## 2021-06-02 NOTE — Patient Instructions (Addendum)
Apply aldar M-W-F to nose and right temple hr before bed.  ?

## 2021-06-21 ENCOUNTER — Encounter: Payer: Self-pay | Admitting: Dermatology

## 2021-06-21 NOTE — Progress Notes (Signed)
? ?  Follow-Up Visit ?  ?Subjective  ?Suzanne Welch is a 71 y.o. female who presents for the following: Skin Problem (Lesion on right side eye x year. Lesion on nose x year. Personal history of bcc. ). ? ?New spots on nose and hand. ?Location:  ?Duration:  ?Quality:  ?Associated Signs/Symptoms: ?Modifying Factors:  ?Severity:  ?Timing: ?Context:  ? ?Objective  ?Well appearing patient in no apparent distress; mood and affect are within normal limits. ?Right Hand - Posterior ?Hornlike actinic keratosis 5 mm right dorsal hand ? ?Dorsum of Nose, Right Temple ?Gritty 4 mm subtle crust ? ? ? ?A focused examination was performed including head, neck, arms. Relevant physical exam findings are noted in the Assessment and Plan. ? ? ?Assessment & Plan  ? ? ?Cutaneous horn ?Right Hand - Posterior ? ?Destruction of lesion - Right Hand - Posterior ?Complexity: simple   ?Destruction method: cryotherapy   ?Informed consent: discussed and consent obtained   ?Timeout:  patient name, date of birth, surgical site, and procedure verified ?Lesion destroyed using liquid nitrogen: Yes   ?Cryotherapy cycles:  5 ?Outcome: patient tolerated procedure well with no complications   ?Post-procedure details: wound care instructions given   ? ?AK (actinic keratosis) (2) ?Right Temple; Dorsum of Nose ? ?Patient okay with trying topical Aldara instead of freezing; apply tiny film on Monday Wednesday and Friday evenings for 6 to 8 weeks or until there is a brisk inflammatory reaction.  She knows she can contact me if there are any questions. ? ?Related Medications ?imiquimod (ALDARA) 5 % cream ?Apply topically at bedtime. Apply to eye and nose Monday, Wednesday , and Friday a hour before bedtime for 6 weeks. ? ? ? ? ? ?I, Lavonna Monarch, MD, have reviewed all documentation for this visit.  The documentation on 06/21/21 for the exam, diagnosis, procedures, and orders are all accurate and complete. ?

## 2021-06-27 ENCOUNTER — Encounter: Payer: Self-pay | Admitting: Dermatology

## 2022-01-05 ENCOUNTER — Ambulatory Visit: Payer: Medicare Other | Admitting: Dermatology
